# Patient Record
Sex: Female | Born: 1990 | Race: Black or African American | Hispanic: No | Marital: Single | Smoking: Never smoker
Health system: Southern US, Community
[De-identification: ages and names within clinical notes are randomized; demographics above are authoritative.]

## PROBLEM LIST (undated history)

## (undated) ENCOUNTER — Emergency Department: Admission: EM | Payer: Commercial Managed Care - PPO | Source: Home / Self Care

## (undated) DIAGNOSIS — D680 Von Willebrand's disease: Secondary | ICD-10-CM

## (undated) DIAGNOSIS — F419 Anxiety disorder, unspecified: Secondary | ICD-10-CM

## (undated) DIAGNOSIS — D691 Qualitative platelet defects: Secondary | ICD-10-CM

## (undated) HISTORY — DX: Von Willebrand's disease: D68.0

## (undated) HISTORY — DX: Anxiety disorder, unspecified: F41.9

---
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
## 2013-01-29 DIAGNOSIS — D691 Qualitative platelet defects: Secondary | ICD-10-CM

## 2013-01-29 HISTORY — DX: Qualitative platelet defects: D69.1

## 2013-08-27 ENCOUNTER — Encounter: Payer: Self-pay | Admitting: Obstetrics & Gynecology

## 2013-08-27 LAB — CBC WITH DIFFERENTIAL/PLATELET
BASOS ABS: 0.1 10*3/uL (ref 0.0–0.1)
Basophil %: 0.5 %
EOS PCT: 0.9 %
Eosinophil #: 0.1 10*3/uL (ref 0.0–0.7)
HCT: 37.6 % (ref 35.0–47.0)
HGB: 11.8 g/dL — ABNORMAL LOW (ref 12.0–16.0)
Lymphocyte #: 2 10*3/uL (ref 1.0–3.6)
Lymphocyte %: 17.6 %
MCH: 27.2 pg (ref 26.0–34.0)
MCHC: 31.4 g/dL — ABNORMAL LOW (ref 32.0–36.0)
MCV: 87 fL (ref 80–100)
Monocyte #: 0.6 x10 3/mm (ref 0.2–0.9)
Monocyte %: 5.6 %
NEUTROS ABS: 8.5 10*3/uL — AB (ref 1.4–6.5)
Neutrophil %: 75.4 %
Platelet: 175 10*3/uL (ref 150–440)
RBC: 4.33 10*6/uL (ref 3.80–5.20)
RDW: 13.5 % (ref 11.5–14.5)
WBC: 11.2 10*3/uL — ABNORMAL HIGH (ref 3.6–11.0)

## 2013-08-27 LAB — PROTIME-INR
INR: 1
PROTHROMBIN TIME: 13.2 s (ref 11.5–14.7)

## 2013-08-27 LAB — PLATELET FUNCTION ASSAY
COL/ADP PLT FXN SCRN: 71 Seconds (ref 0–100)
COL/EPI PLT FXN SCRN: 109 Seconds

## 2013-09-07 ENCOUNTER — Encounter: Payer: Self-pay | Admitting: Maternal & Fetal Medicine

## 2014-01-05 ENCOUNTER — Inpatient Hospital Stay: Payer: Self-pay

## 2014-01-05 LAB — CBC WITH DIFFERENTIAL/PLATELET
Basophil #: 0 10*3/uL (ref 0.0–0.1)
Basophil %: 0.3 %
EOS ABS: 0 10*3/uL (ref 0.0–0.7)
Eosinophil %: 0.1 %
HCT: 38.2 % (ref 35.0–47.0)
HGB: 12.2 g/dL (ref 12.0–16.0)
LYMPHS ABS: 1.9 10*3/uL (ref 1.0–3.6)
LYMPHS PCT: 13.7 %
MCH: 27.4 pg (ref 26.0–34.0)
MCHC: 31.9 g/dL — ABNORMAL LOW (ref 32.0–36.0)
MCV: 86 fL (ref 80–100)
Monocyte #: 0.5 x10 3/mm (ref 0.2–0.9)
Monocyte %: 3.4 %
NEUTROS ABS: 11.7 10*3/uL — AB (ref 1.4–6.5)
Neutrophil %: 82.5 %
Platelet: 148 10*3/uL — ABNORMAL LOW (ref 150–440)
RBC: 4.45 10*6/uL (ref 3.80–5.20)
RDW: 14.2 % (ref 11.5–14.5)
WBC: 14.1 10*3/uL — AB (ref 3.6–11.0)

## 2014-01-06 LAB — HEMATOCRIT: HCT: 32 % — ABNORMAL LOW (ref 35.0–47.0)

## 2014-01-06 LAB — GC/CHLAMYDIA PROBE AMP

## 2014-05-22 NOTE — Consult Note (Signed)
Referral Information:  Reason for Referral The patient is a 24 yo gravida 1 at 11085w1d gestation who was orginially referred to [facility] on 08/27/2013 for recommendations regarding her history of a mild bleeding disorder which was thought to be von Willebrand disease.  She saw my partner Dr. Leone BrandGrotegut who ordered labwork.  The patient is back today for me to review those results.   Referring Physician [facility]   Prenatal Hx In 2010, the patient presented to Dr. Artis DelayKnowles-Jones for an evaluation of heavy menstrual cycles resulting in anemia.  Dr. Murlean IbaKnowles-Jones's workup was consistent with iron deficiency anemia.  She also sent a partial thrombophila panel and factor VIII and VWF and VW activity levels.  Both her Factor VIII levels and VWF levels were elevated.  Ms. Barrientes's misunderstanding was that this demonstrated VW disease.  The patient was subsequently seen by Dr. Nils Pylehornberg ([facility]) in November of 2010.  Dr. Nils Pylehornberg explained to the patient that she did NOT have VW disease, but among her platelet aggregation studies, the aggregation response to ADP was 55% with a lower limit of normal of 60%.  Dr. Nils Pylehornberg recommended that she return for followup visit and additional testing but Denny Peonrin did not return.  The patient's perception was that she was never contacted with the results.  In 2010, the patient was placed on continous OCP's which controlled her heavy periods. She was essentially amenorrhic on continuous OCPs from 2010 until late last year. Late last year, she had some breakthrough bleeding on the continuous OCPs and presented to [facility].  They took her off her OCPs.  Denny Peonrin states she had normal, light menstrual cycles for a few months while off the OCPs until she got pregnant.  She had her wisdom teeth removed and had no bleeding complications. She does not report mucosal bleeding or easy bruising.  When she cuts her legs with her razor while shaving she needs to  apply direct pressure for approximately 5 minutes and then the bleeding is controlled.   She has had no complications this pregnancy. She had an [facility] 7/29//[facility] and was told she is having a girl and that the anatomy appeared normal.   Past Obstetrical Hx G1 P0   Home Medications: Medication Instructions Status  PreNata Prenatal Multivitamins oral tablet 1 tab(s) orally once a day Active   Allergies:   No Known Allergies:   Vital Signs/Notes:  Nursing Vital Signs: **Vital Signs.:   10-Aug-15 11:31  Vital Signs Type Routine  Temperature Temperature (F) 97.1  Celsius 36.1  Temperature Source oral  Pulse Pulse 75  Respirations Respirations 18  Systolic BP Systolic BP 98  Diastolic BP (mmHg) Diastolic BP (mmHg) 64  Pulse Ox % Pulse Ox % 100   Perinatal Consult:  PGyn Hx Menstrual history as in HPI.  Denies history of abnormal paps. Remote history of Chl which was treated   Past Medical History cont'd See above   PSurg Hx Wisdom teeth extraction, no bleeding complication   FHx No FH of bleeding or clotting abnormalities. 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 BMI=21     Routine Coag:  30-Jul-15 11:07   Prothrombin 13.2  INR 1.0 (INR reference interval applies to patients on anticoagulant therapy. A  single INR therapeutic range for coumarins is not optimal for all indications; however, the suggested range for most indications is 2.0 - 3.0. Exceptions to the INR Reference Range may include: Prosthetic heart valves, acute myocardial infarction, prevention of myocardial infarction, and combinations of aspirin and anticoagulant. The need for a higher or lower target INR must be assessed individually. Reference: The  Pharmacology and Management of the Vitamin K  antagonists: the seventh ACCP Conference on Antithrombotic and Thrombolytic Therapy. Chest.2004 Sept:126 (3suppl): [id number]. A HCT value >55% may artifactually increase the PT.  In one study,  the increase was an average of 25%. Reference:  "Effect on Routine and Special Coagulation Testing Values of Citrate Anticoagulant Adjustment in Patients with High HCT Values." American Journal of Clinical Pathology 2006;126:400-405.)  Col/EPI 109 (0-150 Prolonged closure time for  the col/EPI screen test may  be seen in the following: Platelet dysfunction that is acquired, inherited, or  induced by platelet inhibiting agents, such as aspirin; HCT <35%, and  Platelet count <150,000. Suggest clinical correlation.)  Col/ADP 71 (Result(s) reported on 27 Aug 2013 at 11:29AM.)  Routine Hem:  30-Jul-15 11:07   WBC (CBC)  11.2  RBC (CBC) 4.33  Hemoglobin (CBC)  11.8  Hematocrit (CBC) 37.6  Platelet Count (CBC) 175  MCV 87  MCH 27.2  MCHC  31.4  RDW 13.5  Neutrophil % 75.4  Lymphocyte % 17.6  Monocyte % 5.6  Eosinophil % 0.9  Basophil % 0.5  Neutrophil #  8.5  Lymphocyte # 2.0  Monocyte # 0.6  Eosinophil # 0.1  Basophil # 0.1 (Result(s) reported on 27 Aug 2013 at 11:34AM.)    Additional Lab/Radiology Notes VW Panel:  VWF:RCo 168%, VWF:Ag 254%, FVIII 326%   Impression/Recommendations:  Impression 24 year-old G1 P0 at 44 1/[redacted] weeks gestation with history of heavy menstrual bleeding and MINIMALLY decreased platelet aggregation to the agonist ADP 5 years ago.  NORMAL platelet function screen on 08/27/2013.  No thrombocytopenia.  NO VON WILLEBRAND DISEASE based on testing 2 times in 2010 and once now.   Recommendations This patient was counseled that she has no more risk of bleeding at the time of delivery or with regional anesthesia than any other patient.  She was counseled that the background risk for PPH is 4%.    My only recommendation is  that she avoid ASA and NSAIDs during the postpartum period.  This is a soft recommendation.  She could by prescribed acetaminophen and opioids instead.  Although we will be happy to see her again if necessary, at the present time, I anticipate no special fetal surveillance, neonatal evaluation or follow-up.    Comments Thank you for allowing [place] to participate in her care.    Total Time Spent with Patient 30 minutes   >50% of visit spent in couseling/coordination of care yes   Office Use Only 99244  Level 4 ( ) NEW office consult low complexity, 99214  Office Visit Level 4 ( ) EST detailed office/outpt   Coding Description: MATERNAL CONDITIONS/HISTORY INDICATION(S).   Bleeding Disorder and/or Pt on heparin/coumadin/lovenox.   OTHER: Abnormalities in platelet aggregation studies. Pregnancy.  Electronic Signatures: Lady Deutscher (MD)  (Signed 10-Aug-15 13:16)  Authored: Referral, Home Medications, Allergies, Vital Signs/Notes, Consult, Exam, Lab, Lab/Radiology Notes, Impression, Other Comments, Billing, Coding Description   Last Updated: 10-Aug-15 13:16 by Lady Deutscher (MD)

## 2014-05-22 NOTE — Consult Note (Signed)
Referral Information:  Reason for Referral von Willebrand's disease in pregnancy   Referring Physician [facility]   Prenatal Hx Autumn Mullen is a 24 year-old G1 P0 at 1 3/7 weeks (EDC 12/28/13) who presents for recommendations concerning the management of von Wilebrand's disease in pregnancy.  In 2010, Autumn Mullen presented to Dr. Toya Smothers for an evaluation of heavy menstrual cycles.  Her menses are started just prior and were heavy and Autumn Mullen was noted to be anemia. Dr. Toya Smothers' workup was consistent with iron deficiency anemia.  She also sent a partial thrombophila panel and factor VIII and VWF and VW activity levels.  Both her Factor VIII levels and VWF levels were elevated.  Autumn. Belote's understanding was this demonstrated VW disease.  Autumn Mullen was seen by Dr. Nils Pyle ([facility]) in November of 2010.  Dr. Nils Pyle explained to Autumn Mullen that she did not have VW disease but she did a platelet aggregation study which was slightly abnormal.  Dr. Nils Pyle recommended that she return for followup visit and additioanl testing but Autumn Mullen did not return.  In 2010, she was placed on continous OCP's which controlled her heavy periods. She was essentially amenorrhic on continuous OCPs from 2010 until late last year. Late last year, she had some breakthrough bleeding on the continuous OCPs and presented to [facility].  They took her off her OCPs.  Autumn Mullen states she had normal, light menstrual cycles for a few months while off the OCPs until she got pregnant.  She had her wisdom teeth removed and had no bleeding complications. She does not report mucosal bleeding or easy bruising.  When she cuts her legs with her razor while shaving she needs to apply direct pressure for approximately 5 minutes and then the bleeding is controlled.   She has had no complications this pregnancy. She had an ultrasound yesterday at [facility] and was told she is having a girl and that the anatomy appeared  normal.   Past Obstetrical Hx G1 P0   Home Medications: Medication Instructions Status  PreNata Prenatal Multivitamins oral tablet 1 tab(s) orally once a day Active   Allergies:   No Known Allergies:   Vital Signs/Notes:  Nursing Vital Signs: **Vital Signs.:   30-Jul-15 10:21  Pulse Pulse 85  Systolic BP Systolic BP 101  Diastolic BP (mmHg) Diastolic BP (mmHg) 60   Perinatal Consult:  PGyn Hx Menstrual history as in HPI.  Denies history of abnormal paps. 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  Normal coagulation papel   PSurg Hx Wisdom teeth extraction, no bleeding complication   FHx No FH of bleeding or clotting abnormalities. 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 See HPI.   Fever/Chills No   Abdominal Pain No   Nausea/Vomiting No   SOB/DOE No   Tolerating Diet Yes   Medications/Allergies Reviewed Medications/Allergies reviewed    Additional Lab/Radiology Notes Autumn Mullen 2010: Prothrombin gene mutation neg, Factor V Leiden neg, AT III 106%, Protein C functional 97%, Protein S functions 48%, Factor VIII activity 433% (elevated), VW factor 234% (elevated), VW activity 100%, Normal hemoglobin electrophoresis (hgb A 98.8%, A2 1.2%, Iron sat 3% (low), Serum iron 13 (low), LDH  187, Pt 10.4, INR 1.0, PTT 27, B12 989 (high), folate 15.2 (normal)  Autumn Mullen 2010:  AT III 128%, Protein C functional 102%, Protein S functional 68%, Fibrinogen 346, Plt aggregation: Normal to collagen, 55 (low) to ADP, Normal to Ristocetin, arachidonic acid and Tris Saline, Hct 30, MCV 63, Platelet aggregation to Epi normal, Retic count 52 with elevated immature retic  fraction (19/1)  Prenatal labs: Pap (08/17/13): Normal GC/Chl/Trich: All neg Other prenatal labs not provided   Impression/Recommendations:  Impression Autumn Mullen is a 24 year-old G1 P0 at 6922 3/7 weeks who was worked up for possible VWD in 2010 due to a heavy menstrual cycles at age 24.  She was under the impression that she had VWD. Her workup demonstrated elevated Factor VIII and VWF levels, which is not consistent with VWD.  She was then seen by [facility] and found to have a mildly abnormal platelet aggregation study to ADP, which may be consistent with a platelet aggregation abnormality.  Her fibriongen and coagulation testing have been normal, therefore has no evidence of clotting factor deficiency or hypofibrinogenemia. She does not report a history of surgical bleeding but requires direct pressure for approximately five minutes when cuts herself with a razor.   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  Autumn Mullen also has a history of iron-deficiency anemia and her anemia workup in the past has been suggestive of iron-deficiency anemia though she never has had testing for alpha thalessemia (has a normal hemoglobin electrophoresis, therefore doubt beta-thal).  It is reassuring that she has not had significant surgical bleeding with wisdom teeth extraction and recent menses in the last year off of OCPs have not been heavy.  I have sent the following studies: -CBC -PT/PTT -VW panel including Factor VIII level -Platelet aggregation studies  I have asked her to return in two weeks when Dr. Fayrene FearingJames is here to review these results and help make any additional recommendations.    Total Time Spent with Patient 60 minutes   >50% of visit spent in couseling/coordination of care yes   Office Use Only 99244  Level 4 (60min) NEW office consult low complexity   Coding Description: MATERNAL CONDITIONS/HISTORY INDICATION(S).   OTHER:  Abnormalities in platelet aggregation studies. Pregnancy.  Electronic Signatures: Maxie Slovacek, [place] (MD)  (Signed 30-Jul-15 12:37)  Authored: Referral, Home Medications, Allergies, Vital Signs/Notes, Consult, Exam, Lab/Radiology Notes, Impression, Billing, Coding Description   Last Updated: 30-Jul-15 12:37 by Alistar Mcenery, [place] (MD)

## 2014-06-08 NOTE — H&P (Signed)
L&D Evaluation:  History:  HPI PT is a 24 yo G2P0010 at [redacted] weeks GA by a 21 week u/s presents with contractions that have gotten stronger since yesterday. She reports +FM, denies vb, or lof. Her prenatal course is significant for a possible bleeding disorder which was ruled out by [facility], late entry to care, and anemia. She is B+, RI, VI, GBS positive.   Presents with contractions   Patient's Medical History abnormal pap, anemia   Patient's Surgical History colpo, wisdom teeth   Medications Pre Natal Vitamins  Iron   Allergies NKDA   Social History none   Family History Non-Contributory   ROS:  ROS All systems were reviewed.  HEENT, CNS, GI, GU, Respiratory, CV, Renal and Musculoskeletal systems were found to be normal.   Exam:  Vital Signs stable   General no apparent distress   Mental Status clear   Chest clear   Heart normal sinus rhythm   Abdomen gravid, tender with contractions   Pelvic 3/75/-1 upon arrival now 3/90/-1   Mebranes Intact   FHT normal rate with no decels, 130's, moderate variability, +accels, no decels   Ucx regular, every 2-5   Skin dry, no lesions, no rashes   Lymph no lymphadenopathy   Impression:  Impression early labor, reactive NST, IUP at 41 weeks, r/o labor   Plan:  Plan EFM/NST, monitor contractions and for cervical change, continue to monitor for cervical change, ambulate.   Comments dispo pending cervical check- pt has continued to efface her cervix since she has been under observation.   If admitted- per [facility] try to avoid NSAID's in the postpartum period due to increased bleeding.   Follow Up Appointment already scheduled   Electronic Signatures: Jannet MantisSubudhi, Coleby Yett (CNM)  (Signed 08-Dec-15 06:51)  Authored: L&D Evaluation   Last Updated: 08-Dec-15 06:51 by Jannet MantisSubudhi, Breklyn Fabrizio (CNM)

## 2015-12-09 LAB — HM PAP SMEAR: HM Pap smear: NEGATIVE

## 2017-01-11 ENCOUNTER — Ambulatory Visit: Payer: Commercial Managed Care - PPO | Admitting: Advanced Practice Midwife

## 2017-01-11 ENCOUNTER — Encounter: Payer: Self-pay | Admitting: Advanced Practice Midwife

## 2017-01-11 VITALS — BP 110/66 | HR 59 | Ht 62.0 in | Wt 107.0 lb

## 2017-01-11 DIAGNOSIS — Z113 Encounter for screening for infections with a predominantly sexual mode of transmission: Secondary | ICD-10-CM

## 2017-01-11 NOTE — Progress Notes (Signed)
S: The patient is here today with concerns for possible exposure to STDs. Her boyfriend told her today that his former girlfriend had positive trichomonas infection and that he may have been infected. The patient most recently had unprotected intercourse 2 weeks ago. She is due to come in for her annual exam in about 2 weeks. She denies symptoms such as itching, irritation, burning, discharge, odor. She requests testing for STDs. She has no other concerns today.  O: Vital Signs: BP 110/66 (BP Location: Left Arm, Patient Position: Sitting, Cuff Size: Normal)   Pulse (!) 59   Ht 5\' 2"  (1.575 m)   Wt 107 lb (48.5 kg)   LMP 01/02/2017   BMI 19.57 kg/m  Constitutional: Well nourished, well developed female in no acute distress.  HEENT: normal Skin: Warm and dry.  Cardiovascular: Regular rate and rhythm.    Respiratory: Clear to auscultation bilateral. Normal respiratory effort Psych: Alert and Oriented x3. No memory deficits. Normal mood and affect.  MS: normal gait, normal bilateral lower extremity ROM/strength/stability.  Pelvic exam:  is not limited by body habitus EGBUS: within normal limits Vagina: within normal limits and with normal mucosa  Cervix: normal appearance  A: 26 yo female with possible exposure to STDs  P: NuSwab for STDs Return to office for annual exam and PRN  Tresea MallJane Brealyn Baril, CNM

## 2017-01-16 ENCOUNTER — Other Ambulatory Visit: Payer: Self-pay | Admitting: Advanced Practice Midwife

## 2017-01-16 DIAGNOSIS — A5403 Gonococcal cervicitis, unspecified: Secondary | ICD-10-CM

## 2017-01-16 LAB — CHLAMYDIA/GONOCOCCUS/TRICHOMONAS, NAA
CHLAMYDIA BY NAA: NEGATIVE
Gonococcus by NAA: POSITIVE — AB
Trich vag by NAA: NEGATIVE

## 2017-01-16 MED ORDER — CEFTRIAXONE SODIUM 250 MG IJ SOLR
250.0000 mg | Freq: Once | INTRAMUSCULAR | Status: AC
  Administered 2017-01-17: 250 mg via INTRAMUSCULAR
  Filled 2017-01-16: qty 250

## 2017-01-16 MED ORDER — AZITHROMYCIN 500 MG PO TABS: 1000.0000 mg | ORAL_TABLET | Freq: Once | ORAL | 0 refills | Status: AC

## 2017-01-16 NOTE — Progress Notes (Signed)
The patient was seen on 01/11/2017 for STD testing. The result on 01/16/2017 is gonorrhea positive. Rx sent to pharmacy for Azithromycin and to clinic for Ceftriaxone on 01/16/2017. Patient notified on 01/16/2017. Communicable disease reporting to health department on 01/16/2017.  Tresea MallJane Aleira Deiter, CNM

## 2017-01-17 ENCOUNTER — Ambulatory Visit (INDEPENDENT_AMBULATORY_CARE_PROVIDER_SITE_OTHER): Payer: Commercial Managed Care - PPO

## 2017-01-17 DIAGNOSIS — A5403 Gonococcal cervicitis, unspecified: Secondary | ICD-10-CM

## 2017-01-17 DIAGNOSIS — A549 Gonococcal infection, unspecified: Secondary | ICD-10-CM

## 2017-01-21 ENCOUNTER — Encounter: Payer: Self-pay | Admitting: Obstetrics and Gynecology

## 2017-01-21 ENCOUNTER — Ambulatory Visit (INDEPENDENT_AMBULATORY_CARE_PROVIDER_SITE_OTHER): Payer: Commercial Managed Care - PPO | Admitting: Obstetrics and Gynecology

## 2017-01-21 VITALS — BP 90/60 | HR 82 | Ht 62.0 in | Wt 108.0 lb

## 2017-01-21 DIAGNOSIS — Z1331 Encounter for screening for depression: Secondary | ICD-10-CM

## 2017-01-21 DIAGNOSIS — Z1339 Encounter for screening examination for other mental health and behavioral disorders: Secondary | ICD-10-CM

## 2017-01-21 DIAGNOSIS — Z30019 Encounter for initial prescription of contraceptives, unspecified: Secondary | ICD-10-CM

## 2017-01-21 DIAGNOSIS — Z01419 Encounter for gynecological examination (general) (routine) without abnormal findings: Secondary | ICD-10-CM

## 2017-01-21 LAB — POCT WET PREP WITH KOH
Clue Cells Wet Prep HPF POC: NEGATIVE
KOH Prep POC: NEGATIVE
PH, VAGINAL: 4.5
Trichomonas, UA: NEGATIVE

## 2017-01-21 NOTE — Progress Notes (Signed)
Gynecology Annual Exam  PCP: Patient, No Pcp Per  Chief Complaint  Patient presents with  . Gynecologic Exam    History of Present Illness:  [name] is a 26 y.o. G1P1001 who LMP was Patient's last menstrual period was 01/02/2017., presents today for her annual examination.  Her menses are regular every 28-30 days, lasting 5 day(s).  Dysmenorrhea mild, occurring first 1-2 days of flow. She does not have intermenstrual bleeding.  She is single partner, contraception - condoms most of the time.  Last Pap: 1 year  Results were: no abnormalities /neg HPV DNA not done Hx of STDs: gonorrhea diagnosed recently. Treated on 12/15 per CDC guidelines.  History of Chlamydia in 2017.   There is a family history of breast cancer in her paternal great aunt. There is no FH of ovarian cancer. The patient does do self-breast exams.  Tobacco use: The patient denies current or previous tobacco use. Alcohol use: social drinker Exercise: moderately active  Wants to restart contraception.   The patient wears seatbelts: yes.   The patient reports that domestic violence in her life is absent.   Past Medical History:  Diagnosis Date  . Von Willebrand disease (HCC)     Past Surgical History:  Procedure Laterality Date  . WISDOM TOOTH EXTRACTION  2011    Prior to Admission medications   Not on File   Allergies: No Known Allergies  Obstetric History: G1P1001  Social History   Socioeconomic History  . Marital status: Single    Spouse name: Not on file  . Number of children: Not on file  . Years of education: Not on file  . Highest education level: Not on file  Social Needs  . Financial resource strain: Not on file  . Food insecurity - worry: Not on file  . Food insecurity - inability: Not on file  . Transportation needs - medical: Not on file  . Transportation needs - non-medical: Not on file  Occupational History  . Not on file  Tobacco Use  . Smoking status: Never Smoker  .  Smokeless tobacco: Never Used  Substance and Sexual Activity  . Alcohol use: No    Frequency: Never  . Drug use: No  . Sexual activity: Yes    Partners: Male    Birth control/protection: None  Other Topics Concern  . Not on file  Social History Narrative  . Not on file    Family History  Problem Relation Age of Onset  . Breast cancer Other   . Brain cancer Other     Review of Systems  Constitutional: Negative.   HENT: Negative.   Eyes: Negative.   Respiratory: Negative.   Cardiovascular: Negative.   Gastrointestinal: Negative.   Genitourinary: Negative.   Musculoskeletal: Negative.   Skin: Negative.   Neurological: Negative.   Psychiatric/Behavioral: Negative.      Physical Exam BP 90/60   Pulse 82   Ht 5\' 2"  (1.575 m)   Wt 108 lb (49 kg)   LMP 01/02/2017   BMI 19.75 kg/m    Physical Exam  Constitutional: She is oriented to person, place, and time. She appears well-developed and well-nourished. No distress.  Genitourinary: Uterus normal. Pelvic exam was performed with patient supine. There is no rash, tenderness, lesion or injury on the right labia. There is no rash, tenderness, lesion or injury on the left labia. No erythema, tenderness or bleeding in the vagina. No signs of injury around the vagina. No vaginal  discharge found. Right adnexum does not display mass, does not display tenderness and does not display fullness. Left adnexum does not display mass, does not display tenderness and does not display fullness. Cervix does not exhibit motion tenderness, lesion, discharge or polyp.   Uterus is mobile and anteverted. Uterus is not enlarged, tender or exhibiting a mass.  HENT:  Head: Normocephalic and atraumatic.  Eyes: EOM are normal. No scleral icterus.  Neck: Normal range of motion. Neck supple. No thyromegaly present.  Cardiovascular: Normal rate and regular rhythm. Exam reveals no gallop and no friction rub.  No murmur heard. Pulmonary/Chest: Effort normal  and breath sounds normal. No respiratory distress. She has no wheezes. She has no rales.  Breast exam declined due to recommendation of every 1-2 year breast exams and no concerns today.  Abdominal: Soft. Bowel sounds are normal. She exhibits no distension and no mass. There is no tenderness. There is no rebound and no guarding.  Musculoskeletal: Normal range of motion. She exhibits no edema or tenderness.  Lymphadenopathy:    She has no cervical adenopathy.       Right: No inguinal adenopathy present.       Left: No inguinal adenopathy present.  Neurological: She is alert and oriented to person, place, and time. No cranial nerve deficit.  Skin: Skin is warm and dry. No rash noted. No erythema.  Psychiatric: She has a normal mood and affect. Her behavior is normal. Judgment normal.   Female chaperone present for pelvic and breast  portions of the physical exam  Wet Prep: PH: 4.5 Clue Cells: Negative Fungal elements: Negative Trichomonas: Negative  Results: AUDIT Questionnaire (screen for alcoholism): 2 PHQ-9: 2   Assessment: 26 y.o. 581P1001 female here for routine annual gynecologic examination  Plan: Problem List Items Addressed This Visit    None    Visit Diagnoses    Women's annual routine gynecological examination    -  Primary   Screening for depression       Screening for alcoholism       Encounter for initial prescription of contraceptives, unspecified contraceptive          Screening: -- Blood pressure screen normal -- Weight screening: normal -- Depression screening negative (PHQ-9) -- Nutrition: normal -- cholesterol screening: not due for screening -- osteoporosis screening: not due -- tobacco screening: not using -- alcohol screening: AUDIT questionnaire indicates low-risk usage. -- family history of breast cancer screening: done. not at high risk. -- no evidence of domestic violence or intimate partner violence. -- STD screening: gonorrhea/chlamydia NAAT  not collected given recent testing -- pap smear not collected per ASCCP guidelines  Reviewed all forms of birth control options available including abstinence; over the counter/barrier methods; hormonal contraceptive medication including pill, patch, ring, injection,contraceptive implant; hormonal and nonhormonal IUDs; permanent sterilization options including vasectomy and the various tubal sterilization modalities. Risks and benefits reviewed.  Questions were answered.  Information was given to patient to review.   Thomasene MohairStephen Robert Sunga, MD 01/21/2017 10:09 AM

## 2017-11-26 ENCOUNTER — Other Ambulatory Visit (HOSPITAL_COMMUNITY)
Admission: RE | Admit: 2017-11-26 | Discharge: 2017-11-26 | Disposition: A | Discharge status: Home / Self Care | Payer: Commercial Managed Care - PPO | Source: Ambulatory Visit | Attending: Advanced Practice Midwife | Admitting: Advanced Practice Midwife

## 2017-11-26 ENCOUNTER — Ambulatory Visit (INDEPENDENT_AMBULATORY_CARE_PROVIDER_SITE_OTHER): Payer: Commercial Managed Care - PPO | Admitting: Advanced Practice Midwife

## 2017-11-26 ENCOUNTER — Encounter: Payer: Self-pay | Admitting: Advanced Practice Midwife

## 2017-11-26 VITALS — BP 110/64 | Wt 112.0 lb

## 2017-11-26 DIAGNOSIS — Z3A08 8 weeks gestation of pregnancy: Secondary | ICD-10-CM

## 2017-11-26 DIAGNOSIS — Z113 Encounter for screening for infections with a predominantly sexual mode of transmission: Secondary | ICD-10-CM

## 2017-11-26 DIAGNOSIS — Z3481 Encounter for supervision of other normal pregnancy, first trimester: Secondary | ICD-10-CM | POA: Diagnosis not present

## 2017-11-26 DIAGNOSIS — Z3480 Encounter for supervision of other normal pregnancy, unspecified trimester: Secondary | ICD-10-CM | POA: Insufficient documentation

## 2017-11-26 NOTE — Progress Notes (Signed)
NOB today.  

## 2017-11-26 NOTE — Progress Notes (Signed)
New Obstetric Patient H&P    Chief Complaint: "Desires prenatal care"   History of Present Illness: Patient is a 27 y.o. G2P1001 Not Hispanic or Latino female, presents with amenorrhea and positive home pregnancy test. Patient's last menstrual period was 10/01/2017 (approximate). and based on her  LMP, her EDD is Estimated Date of Delivery: 07/08/18 and her EGA is [redacted]w[redacted]d. Cycles are 4. days, regular, and occur approximately every : 28 days. Her last pap smear was 2 years ago and was no abnormalities.    She had a urine pregnancy test which was positive 1 week(s)  ago. Her last menstrual period was normal and lasted for  4 day(s). Since her LMP she claims she has experienced breast tenderness and fatigue. She denies vaginal bleeding. Her past medical history is noncontributory. Her prior pregnancies are notable for 2015 FT SVD, anemia  Since her LMP, she admits to the use of tobacco products  no She claims she has gained   no pounds since the start of her pregnancy.  There are cats in the home in the home  no  She admits close contact with children on a regular basis  yes  She has had chicken pox in the past yes She has had Tuberculosis exposures, symptoms, or previously tested positive for TB   no Current or past history of domestic violence. no  Genetic Screening/Teratology Counseling: (Includes patient, baby's father, or anyone in either family with:)   1. Patient's age >/= 54 at [facility]  no 2. Thalassemia ([place], [place], Mediterranean, or Asian background): MCV<80  no 3. Neural tube defect (meningomyelocele, spina bifida, anencephaly)  no 4. Congenital heart defect  no  5. Down syndrome  no 6. Tay-Sachs (Jewish, [place]  no 7. Canavan's Disease  no 8. Sickle cell disease or trait (African)  no  9. Hemophilia or other blood disorders  no  10. Muscular dystrophy  no  11. Cystic fibrosis  no  12. Huntington's Chorea  no  13. Mental retardation/autism  no 14. Other inherited  genetic or chromosomal disorder  no 15. Maternal metabolic disorder (DM, PKU, etc)  no 16. Patient or FOB with a child with a birth defect not listed above no  16a. Patient or FOB with a birth defect themselves no 17. Recurrent pregnancy loss, or stillbirth  no  18. Any medications since LMP other than prenatal vitamins (include vitamins, supplements, OTC meds, drugs, alcohol)  no 19. Any other genetic/environmental exposure to discuss  She works with oil/gas and uses Glass blower/designer.  Infection History:   1. Lives with someone with TB or TB exposed  no  2. Patient or partner has history of genital herpes  no 3. Rash or viral illness since LMP  no 4. History of STI (GC, CT, HPV, syphilis, HIV)  no 5. History of recent travel :  no  Other pertinent information:  no     Review of Systems:10 point review of systems negative unless otherwise noted in HPI  Past Medical History:  Past Medical History:  Diagnosis Date  . Von Willebrand disease (HCC)     Past Surgical History:  Past Surgical History:  Procedure Laterality Date  . WISDOM TOOTH EXTRACTION  2011    Gynecologic History: Patient's last menstrual period was 10/01/2017 (approximate).  Obstetric History: G2P1001  Family History:  Family History  Problem Relation Age of Onset  . Breast cancer Other   . Brain cancer Other     Social History:  Social  History   Socioeconomic History  . Marital status: Single    Spouse name: Not on file  . Number of children: Not on file  . Years of education: Not on file  . Highest education level: Not on file  Occupational History  . Not on file  Social Needs  . Financial resource strain: Not on file  . Food insecurity:    Worry: Not on file    Inability: Not on file  . Transportation needs:    Medical: Not on file    Non-medical: Not on file  Tobacco Use  . Smoking status: Never Smoker  . Smokeless tobacco: Never Used  Substance and Sexual Activity  . Alcohol  use: No    Frequency: Never  . Drug use: No  . Sexual activity: Yes    Partners: Male    Birth control/protection: None  Lifestyle  . Physical activity:    Days per week: 0 days    Minutes per session: 0 min  . Stress: Only a little  Relationships  . Social connections:    Talks on phone: More than three times a week    Gets together: Once a week    Attends religious service: More than 4 times per year    Active member of club or organization: Yes    Attends meetings of clubs or organizations: More than 4 times per year    Relationship status: Never married  . Intimate partner violence:    Fear of current or ex partner: No    Emotionally abused: No    Physically abused: No    Forced sexual activity: No  Other Topics Concern  . Not on file  Social History Narrative  . Not on file    Allergies:  No Known Allergies  Medications: Prior to Admission medications   Not on File    Physical Exam Vitals: Blood pressure 110/64, weight 112 lb (50.8 kg), last menstrual period 10/01/2017.  General: NAD HEENT: normocephalic, anicteric Thyroid: no enlargement, no palpable nodules Pulmonary: No increased work of breathing, CTAB Cardiovascular: RRR, distal pulses 2+ Abdomen: NABS, soft, non-tender, non-distended.  Umbilicus without lesions.  No hepatomegaly, splenomegaly or masses palpable. No evidence of hernia  Genitourinary: deferred for PAP interval/no concerns/shared decision making Extremities: no edema, erythema, or tenderness Neurologic: Grossly intact Psychiatric: mood appropriate, affect full   Assessment: 27 y.o. G2P1001 at [redacted]w[redacted]d by approximate LMP, presenting to initiate prenatal care  Plan: 1) Avoid alcoholic beverages. 2) Patient encouraged not to smoke.  3) Discontinue the use of all non-medicinal drugs and chemicals.  4) Take prenatal vitamins daily.  5) Nutrition, food safety (fish, cheese advisories, and high nitrite foods) and exercise discussed. 6)  Hospital and practice style discussed with cross coverage system.  7) Genetic Screening, such as with 1st Trimester Screening, cell free fetal DNA, AFP testing, and Ultrasound, as well as with amniocentesis and CVS as appropriate, is discussed with patient. At the conclusion of today's visit patient requested genetic testing 8) Patient is asked about travel to areas at risk for the Zika virus, and counseled to avoid travel and exposure to mosquitoes or sexual partners who may have themselves been exposed to the virus. Testing is discussed, and will be ordered as appropriate.  9) Urine lab work today 10) Return in 1 week for dating scan and rob 11) NOB panel and sickle cell screen at genetic screening visit   Tresea Mall, CNM [facility], [facility] 11/26/2017, 10:48 AM

## 2017-11-26 NOTE — Patient Instructions (Signed)
Prenatal Care WHAT IS PRENATAL CARE? Prenatal care is the process of caring for a pregnant woman before she gives birth. Prenatal care makes sure that she and her baby remain as healthy as possible throughout pregnancy. Prenatal care may be provided by a midwife, family practice health care provider, or a childbirth and pregnancy specialist (obstetrician). Prenatal care may include physical examinations, testing, treatments, and education on nutrition, lifestyle, and social support services. WHY IS PRENATAL CARE SO IMPORTANT? Early and consistent prenatal care increases the chance that you and your baby will remain healthy throughout your pregnancy. This type of care also decreases a baby's risk of being born too early (prematurely), or being born smaller than expected (small for gestational age). Any underlying medical conditions you may have that could pose a risk during your pregnancy are discussed during prenatal care visits. You will also be monitored regularly for any new conditions that may arise during your pregnancy so they can be treated quickly and effectively. WHAT HAPPENS DURING PRENATAL CARE VISITS? Prenatal care visits may include the following: Discussion Tell your health care provider about any new signs or symptoms you have experienced since your last visit. These might include:  Nausea or vomiting.  Increased or decreased level of energy.  Difficulty sleeping.  Back or leg pain.  Weight changes.  Frequent urination.  Shortness of breath with physical activity.  Changes in your skin, such as the development of a rash or itchiness.  Vaginal discharge or bleeding.  Feelings of excitement or nervousness.  Changes in your baby's movements.  You may want to write down any questions or topics you want to discuss with your health care provider and bring them with you to your appointment. Examination During your first prenatal care visit, you will likely have a complete  physical exam. Your health care provider will often examine your vagina, cervix, and the position of your uterus, as well as check your heart, lungs, and other body systems. As your pregnancy progresses, your health care provider will measure the size of your uterus and your baby's position inside your uterus. He or she may also examine you for early signs of labor. Your prenatal visits may also include checking your blood pressure and, after about 10-12 weeks of pregnancy, listening to your baby's heartbeat. Testing Regular testing often includes:  Urinalysis. This checks your urine for glucose, protein, or signs of infection.  Blood count. This checks the levels of white and red blood cells in your body.  Tests for sexually transmitted infections (STIs). Testing for STIs at the beginning of pregnancy is routinely done and is required in many states.  Antibody testing. You will be checked to see if you are immune to certain illnesses, such as rubella, that can affect a developing fetus.  Glucose screen. Around 24-28 weeks of pregnancy, your blood glucose level will be checked for signs of gestational diabetes. Follow-up tests may be recommended.  Group B strep. This is a bacteria that is commonly found inside a woman's vagina. This test will inform your health care provider if you need an antibiotic to reduce the amount of this bacteria in your body prior to labor and childbirth.  Ultrasound. Many pregnant women undergo an ultrasound screening around 18-20 weeks of pregnancy to evaluate the health of the fetus and check for any developmental abnormalities.  HIV (human immunodeficiency virus) testing. Early in your pregnancy, you will be screened for HIV. If you are at high risk for HIV, this test may   be repeated during your third trimester of pregnancy.  You may be offered other testing based on your age, personal or family medical history, or other factors. HOW OFTEN SHOULD I PLAN TO SEE MY  HEALTH CARE PROVIDER FOR PRENATAL CARE? Your prenatal care check-up schedule depends on any medical conditions you have before, or develop during, your pregnancy. If you do not have any underlying medical conditions, you will likely be seen for checkups:  Monthly, during the first 6 months of pregnancy.  Twice a month during months 7 and 8 of pregnancy.  Weekly starting in the 9th month of pregnancy and until delivery.  If you develop signs of early labor or other concerning signs or symptoms, you may need to see your health care provider more often. Ask your health care provider what prenatal care schedule is best for you. WHAT CAN I DO TO KEEP MYSELF AND MY BABY AS HEALTHY AS POSSIBLE DURING MY PREGNANCY?  Take a prenatal vitamin containing 400 micrograms (0.4 mg) of folic acid every day. Your health care provider may also ask you to take additional vitamins such as iodine, vitamin D, iron, copper, and zinc.  Take 1500-2000 mg of calcium daily starting at your 20th week of pregnancy until you deliver your baby.  Make sure you are up to date on your vaccinations. Unless directed otherwise by your health care provider: ? You should receive a tetanus, diphtheria, and pertussis (Tdap) vaccination between the 27th and 36th week of your pregnancy, regardless of when your last Tdap immunization occurred. This helps protect your baby from whooping cough (pertussis) after he or she is born. ? You should receive an annual inactivated influenza vaccine (IIV) to help protect you and your baby from influenza. This can be done at any point during your pregnancy.  Eat a well-rounded diet that includes: ? Fresh fruits and vegetables. ? Lean proteins. ? Calcium-rich foods such as milk, yogurt, hard cheeses, and dark, leafy greens. ? Whole grain breads.  Do noteat seafood high in mercury, including: ? Swordfish. ? Tilefish. ? Shark. ? King mackerel. ? More than 6 oz tuna per week.  Do not  eat: ? Raw or undercooked meats or eggs. ? Unpasteurized foods, such as soft cheeses (brie, blue, or feta), juices, and milks. ? Lunch meats. ? Hot dogs that have not been heated until they are steaming.  Drink enough water to keep your urine clear or pale yellow. For many women, this may be 10 or more 8 oz glasses of water each day. Keeping yourself hydrated helps deliver nutrients to your baby and may prevent the start of pre-term uterine contractions.  Do not use any tobacco products including cigarettes, chewing tobacco, or electronic cigarettes. If you need help quitting, ask your health care provider.  Do not drink beverages containing alcohol. No safe level of alcohol consumption during pregnancy has been determined.  Do not use any illegal drugs. These can harm your developing baby or cause a miscarriage.  Ask your health care provider or pharmacist before taking any prescription or over-the-counter medicines, herbs, or supplements.  Limit your caffeine intake to no more than 200 mg per day.  Exercise. Unless told otherwise by your health care provider, try to get 30 minutes of moderate exercise most days of the week. Do not  do high-impact activities, contact sports, or activities with a high risk of falling, such as horseback riding or downhill skiing.  Get plenty of rest.  Avoid anything that raises your   body temperature, such as hot tubs and saunas.  If you own a cat, do not empty its litter box. Bacteria contained in cat feces can cause an infection called toxoplasmosis. This can result in serious harm to the fetus.  Stay away from chemicals such as insecticides, lead, mercury, and cleaning or paint products that contain solvents.  Do not have any X-rays taken unless medically necessary.  Take a childbirth and breastfeeding preparation class. Ask your health care provider if you need a referral or recommendation.  This information is not intended to replace advice given  to you by your health care provider. Make sure you discuss any questions you have with your health care provider. Document Released: 01/18/2003 Document Revised: 06/20/2015 Document Reviewed: 04/01/2013 Elsevier Interactive Patient Education  2017 [employer] Eating Plan for Pregnant Women While you are pregnant, your body will require additional nutrition to help support your growing baby. It is recommended that you consume:  150 additional calories each day during your first trimester.  300 additional calories each day during your second trimester.  300 additional calories each day during your third trimester.  Eating a healthy, well-balanced diet is very important for your health and for your baby's health. You also have a higher need for some vitamins and minerals, such as folic acid, calcium, iron, and vitamin D. What do I need to know about eating during pregnancy?  Do not try to lose weight or go on a diet during pregnancy.  Choose healthy, nutritious foods. Choose  of a sandwich with a glass of milk instead of a candy bar or a high-calorie sugar-sweetened beverage.  Limit your overall intake of foods that have "empty calories." These are foods that have little nutritional value, such as sweets, desserts, candies, sugar-sweetened beverages, and fried foods.  Eat a variety of foods, especially fruits and vegetables.  Take a prenatal vitamin to help meet the additional needs during pregnancy, specifically for folic acid, iron, calcium, and vitamin D.  Remember to stay active. Ask your health care provider for exercise recommendations that are specific to you.  Practice good food safety and cleanliness, such as washing your hands before you eat and after you prepare raw meat. This helps to prevent foodborne illnesses, such as listeriosis, that can be very dangerous for your baby. Ask your health care provider for more information about listeriosis. What does 150 extra calories  look like? Healthy options for an additional 150 calories each day could be any of the following:  Plain low-fat yogurt (6-8 oz) with  cup of berries.  1 apple with 2 teaspoons of peanut butter.  Cut-up vegetables with  cup of hummus.  Low-fat chocolate milk (8 oz or 1 cup).  1 string cheese with 1 medium orange.   of a peanut butter and jelly sandwich on whole-wheat bread (1 tsp of peanut butter).  For 300 calories, you could eat two of those healthy options each day. What is a healthy amount of weight to gain? The recommended amount of weight for you to gain is based on your pre-pregnancy BMI. If your pre-pregnancy BMI was:  Less than 18 (underweight), you should gain 28-40 lb.  18-24.9 (normal), you should gain 25-35 lb.  25-29.9 (overweight), you should gain 15-25 lb.  Greater than 30 (obese), you should gain 11-20 lb.  What if I am having twins or multiples? Generally, pregnant women who will be having twins or multiples may need to increase their daily calories by 300-600 calories each   day. The recommended range for total weight gain is 25-54 lb, depending on your pre-pregnancy BMI. Talk with your health care provider for specific guidance about additional nutritional needs, weight gain, and exercise during your pregnancy. What foods can I eat? Grains Any grains. Try to choose whole grains, such as whole-wheat bread, oatmeal, or brown rice. Vegetables Any vegetables. Try to eat a variety of colors and types of vegetables to get a full range of vitamins and minerals. Remember to wash your vegetables well before eating. Fruits Any fruits. Try to eat a variety of colors and types of fruit to get a full range of vitamins and minerals. Remember to wash your fruits well before eating. Meats and Other Protein Sources Lean meats, including chicken, turkey, fish, and lean cuts of beef, veal, or pork. Make sure that all meats are cooked to "well done." Tofu. Tempeh. Beans. Eggs.  Peanut butter and other nut butters. Seafood, such as shrimp, crab, and lobster. If you choose fish, select types that are higher in omega-3 fatty acids, including salmon, herring, mussels, trout, sardines, and pollock. Make sure that all meats are cooked to food-safe temperatures. Dairy Pasteurized milk and milk alternatives. Pasteurized yogurt and pasteurized cheese. Cottage cheese. Sour cream. Beverages Water. Juices that contain 100% fruit juice or vegetable juice. Caffeine-free teas and decaffeinated coffee. Drinks that contain caffeine are okay to drink, but it is better to avoid caffeine. Keep your total caffeine intake to less than 200 mg each day (12 oz of coffee, tea, or soda) or as directed by your health care provider. Condiments Any pasteurized condiments. Sweets and Desserts Any sweets and desserts. Fats and Oils Any fats and oils. The items listed above may not be a complete list of recommended foods or beverages. Contact your dietitian for more options. What foods are not recommended? Vegetables Unpasteurized (raw) vegetable juices. Fruits Unpasteurized (raw) fruit juices. Meats and Other Protein Sources Cured meats that have nitrates, such as bacon, salami, and hotdogs. Luncheon meats, bologna, or other deli meats (unless they are reheated until they are steaming hot). Refrigerated pate, meat spreads from a meat counter, smoked seafood that is found in the refrigerated section of a store. Raw fish, such as sushi or sashimi. High mercury content fish, such as tilefish, shark, swordfish, and king mackerel. Raw meats, such as tuna or beef tartare. Undercooked meats and poultry. Make sure that all meats are cooked to food-safe temperatures. Dairy Unpasteurized (raw) milk and any foods that have raw milk in them. Soft cheeses, such as feta, queso blanco, queso fresco, Brie, Camembert cheeses, blue-veined cheeses, and Panela cheese (unless it is made with pasteurized milk, which must  be stated on the label). Beverages Alcohol. Sugar-sweetened beverages, such as sodas, teas, or energy drinks. Condiments Homemade fermented foods and drinks, such as pickles, sauerkraut, or kombucha drinks. (Store-bought pasteurized versions of these are okay.) Other Salads that are made in the store, such as ham salad, chicken salad, egg salad, tuna salad, and seafood salad. The items listed above may not be a complete list of foods and beverages to avoid. Contact your dietitian for more information. This information is not intended to replace advice given to you by your health care provider. Make sure you discuss any questions you have with your health care provider. Document Released: 10/30/2013 Document Revised: 06/23/2015 Document Reviewed: 06/30/2013 Elsevier Interactive Patient Education  2018 [employer] Exercise During Pregnancy For people of all ages, exercise is an important part of being healthy. Exercise improves   heart and lung function and helps to maintain strength, flexibility, and a healthy body weight. Exercise also boosts energy levels and elevates mood. For most women, maintaining an exercise routine throughout pregnancy is recommended. It is only on rare occasions and with certain medical conditions or pregnancy complications that women may be asked to limit or avoid exercise during pregnancy. What are some other benefits to exercising during pregnancy? Along with maintaining strength and flexibility, exercising throughout pregnancy can help to:  Keep strength in muscles that are very important during labor and childbirth.  Decrease low back pain during pregnancy.  Decrease the risk of developing gestational diabetes mellitus (GDM).  Improve blood sugar (glucose) control for women who have GDM.  Decrease the risk of developing preeclampsia. This is a serious condition that causes high blood pressure along with other symptoms, such as swelling and  headaches.  Decrease the risk of cesarean delivery.  Speed up the recovery after giving birth.  How often should I exercise? Unless your health care provider gives you different instructions, you should try to exercise on most days or all days of the week. In general, try to exercise with moderate intensity for about 150 minutes per week. This can be spread out across several days, such as exercising for 30 minutes per day on 5 days of each week. You can tell that you are exercising at a moderate intensity if you have a higher heart rate and faster breathing, but you are still able to hold a conversation. What types of moderate-intensity exercise are recommended during pregnancy? There are many types of exercise that are safe for you to do during pregnancy. Unless your health care provider gives you different instructions, do a variety of exercises that safely increase your heart and breathing (cardiopulmonary) rates and help you to build and maintain muscle strength (strength training). You should always be able to talk in full sentences while exercising during pregnancy. Some examples of exercising that is safe to do during pregnancy include:  Brisk walking or hiking.  Swimming.  Water aerobics.  Riding a stationary bike.  Strength training.  Modified yoga or Pilates. Tell your instructor that you are pregnant. Avoid overstretching and avoid lying on your back for long periods of time.  Running or jogging. Only choose this type of exercise if: ? You ran or jogged regularly before your pregnancy. ? You can run or jog and still talk in complete sentences.  What types of exercise should I not do during pregnancy? Depending on your level of fitness and whether you exercised regularly before your pregnancy, you may be advised to limit vigorous-intensity exercise during your pregnancy. You can tell that you are exercising at a vigorous intensity if you are breathing much harder and faster  and cannot hold a conversation while exercising. Some examples of exercising that you should avoid during pregnancy include:  Contact sports.  Activities that place you at risk for falling on or being hit in the belly, such as downhill skiing, water skiing, surfing, rock climbing, cycling, gymnastics, and horseback riding.  Scuba diving.  Sky diving.  Yoga or Pilates in a room that is heated to extreme temperatures ("hot yoga" or "hot Pilates").  Jogging or running, unless you ran or jogged regularly before your pregnancy. While jogging or running, you should always be able to talk in full sentences. Do not run or jog so vigorously that you are unable to have a conversation.  If you are not used to exercising at   elevation (more than 6,000 feet above sea level), do not do so during your pregnancy.  When should I avoid exercising during pregnancy? Certain medical conditions can make it unsafe to exercise during pregnancy, or they may increase your risk of miscarriage or early labor and birth. Some of these conditions include:  Some types of heart disease.  Some types of lung disease.  Placenta previa. This is when the placenta partially or completely covers the opening of the uterus (cervix).  Frequent bleeding from the vagina during your pregnancy.  Incompetent cervix. This is when your cervix does not remain as tightly closed during pregnancy as it should.  Premature labor.  Ruptured membranes. This is when the protective sac (amniotic sac) opens up and amniotic fluid leaks from your vagina.  Severely low blood count (anemia).  Preeclampsia or pregnancy-caused high blood pressure.  Carrying more than one baby (multiple gestation) and having an additional risk of early labor.  Poorly controlled diabetes.  Being severely underweight or severely overweight.  Intrauterine growth restriction. This is when your baby's growth and development during pregnancy are slower than  expected.  Other medical conditions. Ask your health care provider if any apply to you.  What else should I know about exercising during pregnancy? You should take these precautions while exercising during pregnancy:  Avoid overheating. ? Wear loose-fitting, breathable clothes. ? Do not exercise in very high temperatures.  Avoid dehydration. Drink enough water before, during, and after exercise to keep your urine clear or pale yellow.  Avoid overstretching. Because of hormone changes during pregnancy, it is easy to overstretch muscles, tendons, and ligaments during pregnancy.  Start slowly and ask your health care provider to recommend types of exercise that are safe for you, if exercising regularly is new for you.  Pregnancy is not a time for exercising to lose weight. When should I seek medical care? You should stop exercising and call your health care provider if you have any unusual symptoms, such as:  Mild uterine contractions or abdominal cramping.  Dizziness that does not improve with rest.  When should I seek immediate medical care? You should stop exercising and call your local emergency services (911 in the [place] if you have any unusual symptoms, such as:  Sudden, severe pain in your low back or your belly.  Uterine contractions or abdominal cramping that do not improve with rest.  Chest pain.  Bleeding or fluid leaking from your vagina.  Shortness of breath.  This information is not intended to replace advice given to you by your health care provider. Make sure you discuss any questions you have with your health care provider. Document Released: 01/15/2005 Document Revised: 06/15/2015 Document Reviewed: 03/25/2014 Elsevier Interactive Patient Education  2018 [employer] Inc[employer]  

## 2017-11-27 LAB — CERVICOVAGINAL ANCILLARY ONLY
CHLAMYDIA, DNA PROBE: NEGATIVE
Neisseria Gonorrhea: NEGATIVE
Trichomonas: NEGATIVE

## 2017-11-27 LAB — URINE DRUG PANEL 7
Amphetamines, Urine: NEGATIVE ng/mL
BARBITURATE QUANT UR: NEGATIVE ng/mL
Benzodiazepine Quant, Ur: NEGATIVE ng/mL
CANNABINOID QUANT UR: NEGATIVE ng/mL
Cocaine (Metab.): NEGATIVE ng/mL
OPIATE QUANT UR: NEGATIVE ng/mL
PCP Quant, Ur: NEGATIVE ng/mL

## 2017-11-28 LAB — URINE CULTURE

## 2017-11-29 ENCOUNTER — Other Ambulatory Visit: Payer: Self-pay | Admitting: Advanced Practice Midwife

## 2017-11-29 DIAGNOSIS — O2340 Unspecified infection of urinary tract in pregnancy, unspecified trimester: Secondary | ICD-10-CM

## 2017-11-29 MED ORDER — CEPHALEXIN 500 MG PO CAPS: 500.0000 mg | ORAL_CAPSULE | Freq: Two times a day (BID) | ORAL | 0 refills | Status: AC

## 2017-11-29 NOTE — Progress Notes (Signed)
Rx Keflex sent to patient pharmacy. Left message for her regarding prescription.

## 2017-12-04 ENCOUNTER — Other Ambulatory Visit: Payer: Commercial Managed Care - PPO

## 2017-12-04 ENCOUNTER — Encounter: Payer: Commercial Managed Care - PPO | Admitting: Maternal Newborn

## 2017-12-06 ENCOUNTER — Ambulatory Visit (INDEPENDENT_AMBULATORY_CARE_PROVIDER_SITE_OTHER): Payer: Commercial Managed Care - PPO

## 2017-12-06 ENCOUNTER — Ambulatory Visit (INDEPENDENT_AMBULATORY_CARE_PROVIDER_SITE_OTHER): Payer: Commercial Managed Care - PPO | Admitting: Maternal Newborn

## 2017-12-06 ENCOUNTER — Encounter: Payer: Self-pay | Admitting: Maternal Newborn

## 2017-12-06 VITALS — BP 118/80 | Wt 114.0 lb

## 2017-12-06 DIAGNOSIS — Z3481 Encounter for supervision of other normal pregnancy, first trimester: Secondary | ICD-10-CM

## 2017-12-06 DIAGNOSIS — Z3480 Encounter for supervision of other normal pregnancy, unspecified trimester: Secondary | ICD-10-CM

## 2017-12-06 DIAGNOSIS — Z3682 Encounter for antenatal screening for nuchal translucency: Secondary | ICD-10-CM

## 2017-12-06 DIAGNOSIS — Z3A09 9 weeks gestation of pregnancy: Secondary | ICD-10-CM

## 2017-12-06 DIAGNOSIS — O3411 Maternal care for benign tumor of corpus uteri, first trimester: Secondary | ICD-10-CM

## 2017-12-06 DIAGNOSIS — N8311 Corpus luteum cyst of right ovary: Secondary | ICD-10-CM

## 2017-12-06 LAB — POCT URINALYSIS DIPSTICK OB
GLUCOSE, UA: NEGATIVE
POC,PROTEIN,UA: NEGATIVE

## 2017-12-06 NOTE — Progress Notes (Signed)
    Routine Prenatal Care Visit  Subjective  Autumn Mullen is a 27 y.o. G2P1001 at [redacted]w[redacted]d being seen today for ongoing prenatal care.  She is currently monitored for the following issues for this low-risk pregnancy and has Supervision of other normal pregnancy, antepartum on their problem list.  ----------------------------------------------------------------------------------- Patient reports no complaints.    Vag. Bleeding: None.    ----------------------------------------------------------------------------------- The following portions of the patient's history were reviewed and updated as appropriate: allergies, current medications, past family history, past medical history, past social history, past surgical history and problem list. Problem list updated.  Objective  Blood pressure 118/80, weight 114 lb (51.7 kg), last menstrual period 10/01/2017. Pregravid weight 112 lb (50.8 kg) Total Weight Gain 2 lb (0.907 kg) Body mass index is 20.85 kg/m.   Urinalysis: Protein Negative, Glucose Negative  Fetal Status: Fetal Heart Rate (bpm): 178         General:  Alert, oriented and cooperative. Patient is in no acute distress.  Skin: Skin is warm and dry. No rash noted.   Cardiovascular: Normal heart rate noted  Respiratory: Normal respiratory effort, no problems with respiration noted  Abdomen: Soft, gravid, appropriate for gestational age. Pain/Pressure: Absent     Pelvic:  Cervical exam deferred        Extremities: Normal range of motion.     Mental Status: Normal mood and affect. Normal behavior. Normal judgment and thought content.     Assessment   27 y.o. G2P1001 at [redacted]w[redacted]d, EDD 07/08/2018 by Last Menstrual Period presenting for a routine prenatal visit.  Plan   pregnancy Problems (from 11/26/17 to present)    Problem Noted Resolved   Supervision of other normal pregnancy, antepartum 11/26/2017 by Tresea Mall, CNM No   Overview Signed 11/26/2017 10:46 AM by Tresea Mall, CNM   [facility] Prenatal Labs  Dating  Blood type:     Genetic Screen 1 Screen:    AFP:     Quad:     NIPS: Antibody:   Anatomic [place]  Rubella:   Varicella: @VZVIGG @  GTT Early:               Third trimester:  RPR:     Rhogam  HBsAg:     TDaP vaccine   Flu Shot: HIV:     Baby Food                                GBS:   Contraception  Pap:  CBB     CS/VBAC NA   Support Person Autumn Mullen           Dating scan today, singleton IUP with size=dates.  Discussed genetic screening, she may opt for MaterniTi 21 if her insurance will cover it. Otherwise, will do first trimester screen.  Return in about 3 weeks (around 12/27/2017) for ROB with First trimester screen/NT scan.  Marcelyn Bruins, CNM 12/06/2017  2:48 PM

## 2017-12-06 NOTE — Patient Instructions (Signed)
First Trimester of Pregnancy The first trimester of pregnancy is from week 1 until the end of week 13 (months 1 through 3). A week after a sperm fertilizes an egg, the egg will implant on the wall of the uterus. This embryo will begin to develop into a baby. Genes from you and your partner will form the baby. The female genes will determine whether the baby will be a boy or a girl. At 6-8 weeks, the eyes and face will be formed, and the heartbeat can be seen on ultrasound. At the end of 12 weeks, all the baby's organs will be formed. Now that you are pregnant, you will want to do everything you can to have a healthy baby. Two of the most important things are to get good prenatal care and to follow your health care provider's instructions. Prenatal care is all the medical care you receive before the baby's birth. This care will help prevent, find, and treat any problems during the pregnancy and childbirth. Body changes during your first trimester Your body goes through many changes during pregnancy. The changes vary from woman to woman.  You may gain or lose a couple of pounds at first.  You may feel sick to your stomach (nauseous) and you may throw up (vomit). If the vomiting is uncontrollable, call your health care provider.  You may tire easily.  You may develop headaches that can be relieved by medicines. All medicines should be approved by your health care provider.  You may urinate more often. Painful urination may mean you have a bladder infection.  You may develop heartburn as a result of your pregnancy.  You may develop constipation because certain hormones are causing the muscles that push stool through your intestines to slow down.  You may develop hemorrhoids or swollen veins (varicose veins).  Your breasts may begin to grow larger and become tender. Your nipples may stick out more, and the tissue that surrounds them (areola) may become darker.  Your gums may bleed and may be  sensitive to brushing and flossing.  Dark spots or blotches (chloasma, mask of pregnancy) may develop on your face. This will likely fade after the baby is born.  Your menstrual periods will stop.  You may have a loss of appetite.  You may develop cravings for certain kinds of food.  You may have changes in your emotions from day to day, such as being excited to be pregnant or being concerned that something may go wrong with the pregnancy and baby.  You may have more vivid and strange dreams.  You may have changes in your hair. These can include thickening of your hair, rapid growth, and changes in texture. Some women also have hair loss during or after pregnancy, or hair that feels dry or thin. Your hair will most likely return to normal after your baby is born.  What to expect at prenatal visits During a routine prenatal visit:  You will be weighed to make sure you and the baby are growing normally.  Your blood pressure will be taken.  Your abdomen will be measured to track your baby's growth.  The fetal heartbeat will be listened to between weeks 10 and 14 of your pregnancy.  Test results from any previous visits will be discussed.  Your health care provider may ask you:  How you are feeling.  If you are feeling the baby move.  If you have had any abnormal symptoms, such as leaking fluid, bleeding, severe headaches,   or abdominal cramping.  If you are using any tobacco products, including cigarettes, chewing tobacco, and electronic cigarettes.  If you have any questions.  Other tests that may be performed during your first trimester include:  Blood tests to find your blood type and to check for the presence of any previous infections. The tests will also be used to check for low iron levels (anemia) and protein on red blood cells (Rh antibodies). Depending on your risk factors, or if you previously had diabetes during pregnancy, you may have tests to check for high blood  sugar that affects pregnant women (gestational diabetes).  Urine tests to check for infections, diabetes, or protein in the urine.  An ultrasound to confirm the proper growth and development of the baby.  Fetal screens for spinal cord problems (spina bifida) and Down syndrome.  HIV (human immunodeficiency virus) testing. Routine prenatal testing includes screening for HIV, unless you choose not to have this test.  You may need other tests to make sure you and the baby are doing well.  Follow these instructions at home: Medicines  Follow your health care provider's instructions regarding medicine use. Specific medicines may be either safe or unsafe to take during pregnancy.  Take a prenatal vitamin that contains at least 600 micrograms (mcg) of folic acid.  If you develop constipation, try taking a stool softener if your health care provider approves. Eating and drinking  Eat a balanced diet that includes fresh fruits and vegetables, whole grains, good sources of protein such as meat, eggs, or tofu, and low-fat dairy. Your health care provider will help you determine the amount of weight gain that is right for you.  Avoid raw meat and uncooked cheese. These carry germs that can cause birth defects in the baby.  Eating four or five small meals rather than three large meals a day may help relieve nausea and vomiting. If you start to feel nauseous, eating a few soda crackers can be helpful. Drinking liquids between meals, instead of during meals, also seems to help ease nausea and vomiting.  Limit foods that are high in fat and processed sugars, such as fried and sweet foods.  To prevent constipation: ? Eat foods that are high in fiber, such as fresh fruits and vegetables, whole grains, and beans. ? Drink enough fluid to keep your urine clear or pale yellow. Activity  Exercise only as directed by your health care provider. Most women can continue their usual exercise routine during  pregnancy. Try to exercise for 30 minutes at least 5 days a week. Exercising will help you: ? Control your weight. ? Stay in shape. ? Be prepared for labor and delivery.  Experiencing pain or cramping in the lower abdomen or lower back is a good sign that you should stop exercising. Check with your health care provider before continuing with normal exercises.  Try to avoid standing for long periods of time. Move your legs often if you must stand in one place for a long time.  Avoid heavy lifting.  Wear low-heeled shoes and practice good posture.  You may continue to have sex unless your health care provider tells you not to. Relieving pain and discomfort  Wear a good support bra to relieve breast tenderness.  Take warm sitz baths to soothe any pain or discomfort caused by hemorrhoids. Use hemorrhoid cream if your health care provider approves.  Rest with your legs elevated if you have leg cramps or low back pain.  If you develop   varicose veins in your legs, wear support hose. Elevate your feet for 15 minutes, 3-4 times a day. Limit salt in your diet. Prenatal care  Schedule your prenatal visits by the twelfth week of pregnancy. They are usually scheduled monthly at first, then more often in the last 2 months before delivery.  Write down your questions. Take them to your prenatal visits.  Keep all your prenatal visits as told by your health care provider. This is important. Safety  Wear your seat belt at all times when driving.  Make a list of emergency phone numbers, including numbers for family, friends, the hospital, and police and fire departments. General instructions  Ask your health care provider for a referral to a local prenatal education class. Begin classes no later than the beginning of month 6 of your pregnancy.  Ask for help if you have counseling or nutritional needs during pregnancy. Your health care provider can offer advice or refer you to specialists for help  with various needs.  Do not use hot tubs, steam rooms, or saunas.  Do not douche or use tampons or scented sanitary pads.  Do not cross your legs for long periods of time.  Avoid cat litter boxes and soil used by cats. These carry germs that can cause birth defects in the baby and possibly loss of the fetus by miscarriage or stillbirth.  Avoid all smoking, herbs, alcohol, and medicines not prescribed by your health care provider. Chemicals in these products affect the formation and growth of the baby.  Do not use any products that contain nicotine or tobacco, such as cigarettes and e-cigarettes. If you need help quitting, ask your health care provider. You may receive counseling support and other resources to help you quit.  Schedule a dentist appointment. At home, brush your teeth with a soft toothbrush and be gentle when you floss. Contact a health care provider if:  You have dizziness.  You have mild pelvic cramps, pelvic pressure, or nagging pain in the abdominal area.  You have persistent nausea, vomiting, or diarrhea.  You have a bad smelling vaginal discharge.  You have pain when you urinate.  You notice increased swelling in your face, hands, legs, or ankles.  You are exposed to fifth disease or chickenpox.  You are exposed to German measles (rubella) and have never had it. Get help right away if:  You have a fever.  You are leaking fluid from your vagina.  You have spotting or bleeding from your vagina.  You have severe abdominal cramping or pain.  You have rapid weight gain or loss.  You vomit blood or material that looks like coffee grounds.  You develop a severe headache.  You have shortness of breath.  You have any kind of trauma, such as from a fall or a car accident. Summary  The first trimester of pregnancy is from week 1 until the end of week 13 (months 1 through 3).  Your body goes through many changes during pregnancy. The changes vary from  woman to woman.  You will have routine prenatal visits. During those visits, your health care provider will examine you, discuss any test results you may have, and talk with you about how you are feeling. This information is not intended to replace advice given to you by your health care provider. Make sure you discuss any questions you have with your health care provider. Document Released: 01/09/2001 Document Revised: 12/28/2015 Document Reviewed: 12/28/2015 Elsevier Interactive Patient Education  2018 Elsevier   Inc.  

## 2017-12-06 NOTE — Progress Notes (Signed)
ROB/Dating-wants you to take a look at her skin care for face

## 2017-12-30 ENCOUNTER — Ambulatory Visit (INDEPENDENT_AMBULATORY_CARE_PROVIDER_SITE_OTHER): Payer: Commercial Managed Care - PPO

## 2017-12-30 ENCOUNTER — Ambulatory Visit (INDEPENDENT_AMBULATORY_CARE_PROVIDER_SITE_OTHER): Payer: Commercial Managed Care - PPO | Admitting: Obstetrics and Gynecology

## 2017-12-30 ENCOUNTER — Encounter: Payer: Self-pay | Admitting: Obstetrics and Gynecology

## 2017-12-30 VITALS — BP 110/64 | Wt 115.0 lb

## 2017-12-30 DIAGNOSIS — Z3682 Encounter for antenatal screening for nuchal translucency: Secondary | ICD-10-CM

## 2017-12-30 DIAGNOSIS — Z3480 Encounter for supervision of other normal pregnancy, unspecified trimester: Secondary | ICD-10-CM

## 2017-12-30 DIAGNOSIS — Z3481 Encounter for supervision of other normal pregnancy, first trimester: Secondary | ICD-10-CM

## 2017-12-30 DIAGNOSIS — Z3A12 12 weeks gestation of pregnancy: Secondary | ICD-10-CM

## 2017-12-30 DIAGNOSIS — Z1379 Encounter for other screening for genetic and chromosomal anomalies: Secondary | ICD-10-CM

## 2017-12-30 LAB — POCT URINALYSIS DIPSTICK OB
Glucose, UA: NEGATIVE
POC,PROTEIN,UA: NEGATIVE

## 2017-12-30 NOTE — Addendum Note (Signed)
Addended by: Liliane ShiSHAW, BEVERLY M on: 12/30/2017 11:01 AM   Modules accepted: Orders

## 2017-12-30 NOTE — Progress Notes (Signed)
Routine Prenatal Care Visit  Subjective  Autumn Mullen is a 27 y.o. G2P1001 at 3484w6d being seen today for ongoing prenatal care.  She is currently monitored for the following issues for this low-risk pregnancy and has Supervision of other normal pregnancy, antepartum on their problem list.  ----------------------------------------------------------------------------------- Patient reports no complaints.    . Vag. Bleeding: None.   . Denies leaking of fluid.  NT u/s complete. NT measurement 2.3 mm.  ----------------------------------------------------------------------------------- The following portions of the patient's history were reviewed and updated as appropriate: allergies, current medications, past family history, past medical history, past social history, past surgical history and problem list. Problem list updated.   Objective  Blood pressure 110/64, weight 115 lb (52.2 kg), last menstrual period 10/01/2017. Pregravid weight 112 lb (50.8 kg) Total Weight Gain 3 lb (1.361 kg) Urinalysis: Urine Protein    Urine Glucose    Fetal Status: Fetal Heart Rate (bpm): 155         General:  Alert, oriented and cooperative. Patient is in no acute distress.  Skin: Skin is warm and dry. No rash noted.   Cardiovascular: Normal heart rate noted  Respiratory: Normal respiratory effort, no problems with respiration noted  Abdomen: Soft, gravid, appropriate for gestational age.       Pelvic:  Cervical exam deferred        Extremities: Normal range of motion.     Mental Status: Normal mood and affect. Normal behavior. Normal judgment and thought content.   Imaging Results: [place] Fetal Nuchal Translucency Measurement  Result Date: 12/30/2017 Patient Name: Autumn Mullen DOB: 1990-08-23 MRN: [id number] ULTRASOUND REPORT Location: [facility] Date of Service: 12/30/2017 Indications:First Trimester Screen - NT Findings: Singleton intrauterine pregnancy is visualized with a CRL consistent with 1531w5d   gestation, giving an (U/S) EDD of 07/09/2018. The (U/S) EDD is consistent with the clinically established Estimated Date of Delivery: 07/08/18. FHR: 163 BPM CRL measurement: 63.3 mm NT measurement: 2.3 mm. Yolk sac and and early anatomy is normal. Amnion is visualized. Nasal Bone is Present Right Ovary is normal in appearance. Left Ovary is normal in appearance. Survey of the adnexa demonstrates no adnexal masses. There is no free peritoneal fluid in the cul de sac. Impression: 1. 6331w5d viable Singleton Intrauterine pregnancy by U/S. 2. (U/S) EDD is consistent with Clinically established Estimated Date of Delivery: 07/08/18. 3. NT Screen is successfully completed. Darlina GuysAbby M Clarke, RDMS RVT Mital Patel, RDMS There is a viable  singleton gestation.  The fetal biometry correlates with established dating Detailed evaluation of the fetal anatomy is precluded by early gestational age.The NT measurement is less than 3mm.    It must be noted that a normal ultrasound is unable to definitively rule out fetal aneuploidy.  Thomasene MohairStephen Dontarius Sheley, MD, Merlinda FrederickFACOG [facility], [facility] 12/30/2017, 10:37 AM.     Assessment   27 y.o. G2P1001 at 1484w6d by  07/08/2018, by Last Menstrual Period presenting for routine prenatal visit  Plan   pregnancy Problems (from 11/26/17 to present)    Problem Noted Resolved   Supervision of other normal pregnancy, antepartum 11/26/2017 by Tresea MallGledhill, Jane, CNM No   Overview Signed 11/26/2017 10:46 AM by Tresea MallGledhill, Jane, CNM    Clinic [facility] Prenatal Labs  Dating  Blood type:     Genetic Screen 1 Screen:    AFP:     Quad:     NIPS: Antibody:   Anatomic US  Rubella:   Varicella: @VZVIGG @  GTT Early:  Third trimester:  RPR:     Rhogam  HBsAg:     TDaP vaccine   Flu Shot: HIV:     Baby Food                                GBS:   Contraception  Pap:  CBB     CS/VBAC NA   Support Person Terrius             Preterm labor symptoms and general obstetric precautions  including but not limited to vaginal bleeding, contractions, leaking of fluid and fetal movement were reviewed in detail with the patient. Please refer to After Visit Summary for other counseling recommendations.   Return in about 4 weeks (around 01/27/2018) for Routine Prenatal Appointment.  Thomasene Mohair, MD, [facility], [facility] 12/30/2017 10:55 AM

## 2018-01-01 LAB — RPR+RH+ABO+RUB AB+AB SCR+CB...
Antibody Screen: NEGATIVE
HIV Screen 4th Generation wRfx: NONREACTIVE
Hematocrit: 32.8 % — ABNORMAL LOW (ref 34.0–46.6)
Hemoglobin: 9.8 g/dL — ABNORMAL LOW (ref 11.1–15.9)
Hepatitis B Surface Ag: NEGATIVE
MCH: 22.3 pg — ABNORMAL LOW (ref 26.6–33.0)
MCHC: 29.9 g/dL — ABNORMAL LOW (ref 31.5–35.7)
MCV: 75 fL — ABNORMAL LOW (ref 79–97)
Platelets: 283 10*3/uL (ref 150–450)
RBC: 4.39 x10E6/uL (ref 3.77–5.28)
RDW: 20 % — ABNORMAL HIGH (ref 12.3–15.4)
RPR: NONREACTIVE
Rh Factor: POSITIVE
Rubella Antibodies, IGG: 2.56 index (ref >0.99)
Varicella zoster IgG: 590 index (ref >165)
WBC: 10.1 10*3/uL (ref 3.4–10.8)

## 2018-01-01 LAB — FIRST TRIMESTER SCREEN W/NT
CRL: 63.3 mm
DIA MOM: 1.43
DIA VALUE: 400.9 pg/mL
Gest Age-Collect: 12.6 weeks
HCG MOM: 1.23
Maternal Age At EDD: 27.1 yr
Nuchal Translucency MoM: 1.59
Nuchal Translucency: 2.3 mm
Number of Fetuses: 1
PAPP-A MOM: 0.8
PAPP-A VALUE: 1066 ng/mL
Test Results:: NEGATIVE
Weight: 115 [lb_av]
hCG Value: 133.4 IU/mL

## 2018-01-01 LAB — HEMOGLOBINOPATHY EVALUATION
HGB C: 0 %
HGB S: 0 %
HGB VARIANT: 0 %
Hemoglobin A2 Quantitation: 1.6 % — ABNORMAL LOW (ref 1.8–3.2)
Hemoglobin F Quantitation: 0 % (ref 0.0–2.0)
Hgb A: 98.4 % (ref 96.4–98.8)

## 2018-01-15 ENCOUNTER — Telehealth: Payer: Self-pay

## 2018-01-15 NOTE — Telephone Encounter (Signed)
Pt calling to see what she can take for congestion and sore throat.  [phone]  Left detailed msg plain sudafed, warm salt water gargles, hot beverages/soup, push fluids, rest, e.s. tylenol.

## 2018-01-27 ENCOUNTER — Encounter: Payer: Self-pay | Admitting: Obstetrics and Gynecology

## 2018-01-27 ENCOUNTER — Ambulatory Visit (INDEPENDENT_AMBULATORY_CARE_PROVIDER_SITE_OTHER): Payer: Commercial Managed Care - PPO | Admitting: Obstetrics and Gynecology

## 2018-01-27 VITALS — BP 98/60 | Wt 114.0 lb

## 2018-01-27 DIAGNOSIS — Z3482 Encounter for supervision of other normal pregnancy, second trimester: Secondary | ICD-10-CM

## 2018-01-27 DIAGNOSIS — Z3A16 16 weeks gestation of pregnancy: Secondary | ICD-10-CM

## 2018-01-27 DIAGNOSIS — Z3480 Encounter for supervision of other normal pregnancy, unspecified trimester: Secondary | ICD-10-CM

## 2018-01-27 LAB — POCT URINALYSIS DIPSTICK OB: Glucose, UA: NEGATIVE

## 2018-01-27 NOTE — Progress Notes (Signed)
  Routine Prenatal Care Visit  Subjective  Autumn Mullen is a 27 y.o. G2P1001 at 4746w6d being seen today for ongoing prenatal care.  She is currently monitored for the following issues for this low-risk pregnancy and has Supervision of other normal pregnancy, antepartum on their problem list.  ----------------------------------------------------------------------------------- Patient reports mild nausea that started today. No emesis. Declines medication.    . Vag. Bleeding: None.  Movement: Present. Denies leaking of fluid.  ----------------------------------------------------------------------------------- The following portions of the patient's history were reviewed and updated as appropriate: allergies, current medications, past family history, past medical history, past social history, past surgical history and problem list. Problem list updated.   Objective  Blood pressure 98/60, weight 114 lb (51.7 kg), last menstrual period 10/01/2017. Pregravid weight 112 lb (50.8 kg) Total Weight Gain 2 lb (0.907 kg) Urinalysis: Urine Protein Trace  Urine Glucose Negative  Fetal Status: Fetal Heart Rate (bpm): 140   Movement: Present     General:  Alert, oriented and cooperative. Patient is in no acute distress.  Skin: Skin is warm and dry. No rash noted.   Cardiovascular: Normal heart rate noted  Respiratory: Normal respiratory effort, no problems with respiration noted  Abdomen: Soft, gravid, appropriate for gestational age. Pain/Pressure: Absent     Pelvic:  Cervical exam deferred        Extremities: Normal range of motion.     Mental Status: Normal mood and affect. Normal behavior. Normal judgment and thought content.   Assessment   27 y.o. G2P1001 at 3246w6d by  07/08/2018, by Last Menstrual Period presenting for routine prenatal visit  Plan   pregnancy Problems (from 11/26/17 to present)    Problem Noted Resolved   Supervision of other normal pregnancy, antepartum 11/26/2017 by Tresea MallGledhill,  Jane, CNM No   Overview Signed 11/26/2017 10:46 AM by Tresea MallGledhill, Jane, CNM    Clinic [facility] Prenatal Labs  Dating  Blood type:     Genetic Screen 1 Screen:    AFP:     Quad:     NIPS: Antibody:   Anatomic US  Rubella:   Varicella: @VZVIGG @  GTT Early:               Third trimester:  RPR:     Rhogam  HBsAg:     TDaP vaccine   Flu Shot: HIV:     Baby Food                                GBS:   Contraception  Pap:  CBB     CS/VBAC NA   Support Person Terrius              Preterm labor symptoms and general obstetric precautions including but not limited to vaginal bleeding, contractions, leaking of fluid and fetal movement were reviewed in detail with the patient. Please refer to After Visit Summary for other counseling recommendations.   Return in about 2 weeks (around 02/10/2018) for U/S for anatomy and routine prenatal.  Thomasene MohairStephen Zettie Gootee, MD, Merlinda FrederickFACOG [facility], [facility] 01/27/2018 4:34 PM

## 2018-01-29 DIAGNOSIS — O99345 Other mental disorders complicating the puerperium: Secondary | ICD-10-CM

## 2018-01-29 DIAGNOSIS — F53 Postpartum depression: Secondary | ICD-10-CM

## 2018-01-29 HISTORY — DX: Postpartum depression: F53.0

## 2018-01-29 HISTORY — DX: Other mental disorders complicating the puerperium: O99.345

## 2018-01-29 NOTE — L&D Delivery Note (Signed)
Delivery Note Primary OB: [facility] Provider: Avel Sensor, CNM Gestational Age: Full term Antepartum complications: none Intrapartum complications: Shoulder Dystocia  A viable Female was delivered via vertex presentation, LOA. A loose nuchal cord was present and delivered through. Delivery of the shoulders was delayed, and the patient was placed in McRoberts position, which did not accomplish delivery of the anterior shoulder. Suprapubic pressure was applied, which did not accomplish delivery of the anterior shoulder. As I began to reach for the posterior arm, the anterior shoulder rotated and was delivered, followed by the posterior shoulder and body without difficulty. The infant was placed on the maternal abdomen. Following delivery, she was moving all extremities well. The umbilical cord was doubly clamped and cut following delayed cord clamping. The placenta was delivered spontaneously and was inspected and found to be intact with a three vessel cord. The cervix and vagina were inspected. There were no lacerations.  The fundus was firm. Patient and infant were bonding in stable condition. All counts were correct.  Apgars: 8, 9  Weight:  8 lb 0 oz .    Anesthesia:  none Episiotomy:  none Lacerations:  none Suture Repair: none Quantitative Blood Loss (mL):  70  Mom to postpartum.  Baby to Couplet care / Skin to Skin.  Avel Sensor, CNM [facility], [facility] 07/07/2018  10:08 AM

## 2018-02-10 ENCOUNTER — Encounter: Payer: Self-pay | Admitting: Obstetrics and Gynecology

## 2018-02-10 ENCOUNTER — Ambulatory Visit: Payer: Commercial Managed Care - PPO

## 2018-02-10 ENCOUNTER — Ambulatory Visit (INDEPENDENT_AMBULATORY_CARE_PROVIDER_SITE_OTHER): Payer: Commercial Managed Care - PPO | Admitting: Obstetrics and Gynecology

## 2018-02-10 ENCOUNTER — Ambulatory Visit (INDEPENDENT_AMBULATORY_CARE_PROVIDER_SITE_OTHER): Payer: Commercial Managed Care - PPO

## 2018-02-10 ENCOUNTER — Encounter: Payer: Commercial Managed Care - PPO | Admitting: Obstetrics and Gynecology

## 2018-02-10 VITALS — BP 118/70 | Wt 117.0 lb

## 2018-02-10 DIAGNOSIS — Z363 Encounter for antenatal screening for malformations: Secondary | ICD-10-CM | POA: Diagnosis not present

## 2018-02-10 DIAGNOSIS — O99012 Anemia complicating pregnancy, second trimester: Secondary | ICD-10-CM

## 2018-02-10 DIAGNOSIS — Z3A18 18 weeks gestation of pregnancy: Secondary | ICD-10-CM

## 2018-02-10 DIAGNOSIS — Z3480 Encounter for supervision of other normal pregnancy, unspecified trimester: Secondary | ICD-10-CM

## 2018-02-10 DIAGNOSIS — O99612 Diseases of the digestive system complicating pregnancy, second trimester: Secondary | ICD-10-CM

## 2018-02-10 DIAGNOSIS — K5901 Slow transit constipation: Secondary | ICD-10-CM

## 2018-02-10 MED ORDER — DOCUSATE SODIUM 100 MG PO CAPS: 100.0000 mg | ORAL_CAPSULE | Freq: Two times a day (BID) | ORAL | 2 refills | Status: DC | PRN

## 2018-02-10 NOTE — Patient Instructions (Signed)

## 2018-02-10 NOTE — Progress Notes (Signed)
    Routine Prenatal Care Visit  Subjective  Autumn Mullen is a 28 y.o. G2P1001 at [redacted]w[redacted]d being seen today for ongoing prenatal care.  She is currently monitored for the following issues for this low-risk pregnancy and has Supervision of other normal pregnancy, antepartum on their problem list.  ----------------------------------------------------------------------------------- Patient reports no complaints.   Contractions: Not present. Vag. Bleeding: None.  Movement: Present. Denies leaking of fluid.  ----------------------------------------------------------------------------------- The following portions of the patient's history were reviewed and updated as appropriate: allergies, current medications, past family history, past medical history, past social history, past surgical history and problem list. Problem list updated.   Objective  Blood pressure 118/70, weight 117 lb (53.1 kg), last menstrual period 10/01/2017. Pregravid weight 112 lb (50.8 kg) Total Weight Gain 5 lb (2.268 kg) Urinalysis:      Fetal Status: Fetal Heart Rate (bpm): 150   Movement: Present     General:  Alert, oriented and cooperative. Patient is in no acute distress.  Skin: Skin is warm and dry. No rash noted.   Cardiovascular: Normal heart rate noted  Respiratory: Normal respiratory effort, no problems with respiration noted  Abdomen: Soft, gravid, appropriate for gestational age. Pain/Pressure: Absent     Pelvic:  Cervical exam deferred        Extremities: Normal range of motion.     Mental Status: Normal mood and affect. Normal behavior. Normal judgment and thought content.     Assessment   28 y.o. G2P1001 at [redacted]w[redacted]d by  07/08/2018, by Last Menstrual Period presenting for routine prenatal visit  Plan   pregnancy Problems (from 11/26/17 to present)    Problem Noted Resolved   Supervision of other normal pregnancy, antepartum 11/26/2017 by Tresea Mall, CNM No   Overview Addendum 02/10/2018  9:02 AM by  Natale Milch, MD    Clinic [facility] Prenatal Labs  Dating  Blood type: B/Positive/-- (12/02 1100)   Genetic Screen 1 Screen: negative    Antibody:Negative (12/02 1100)  Anatomic [place] Incomplete- girl Rubella: 2.56 (12/02 1100) Varicella: Immune  GTT  Third trimester:  RPR: Non Reactive (12/02 1100)   Rhogam  not needed HBsAg: Negative (12/02 1100)   TDaP vaccine   Flu Shot:Declined HIV: Non Reactive (12/02 1100)   Baby Food                                GBS:   Contraception  Pap:2017 NIL  CBB     CS/VBAC NA   Support Person Terrius              Gestational age appropriate obstetric precautions including but not limited to vaginal bleeding, contractions, leaking of fluid and fetal movement were reviewed in detail with the patient.    Declines flu shot  She is not taking IRON, advised to start. Offered referral to hematology. She declines because she is afraid of needles Discussed risks of hemorrhage and importance of increasing dietary intake of iron and starting iron supplementation.  Provided with hand out of iron rich foods.  Discussed colace and vitamin c supplementation Given list of prenatal classes  Return in about 2 weeks (around 02/24/2018) for ROB and [place].  Natale Milch MD [facility], [facility] 02/10/2018, 9:04 AM

## 2018-02-10 NOTE — Progress Notes (Signed)
ROB/Anatomy  No concerns  ITS A GIRL!

## 2018-02-11 LAB — IRON AND TIBC
IRON SATURATION: 4 % — AB (ref 15–55)
Iron: 19 ug/dL — ABNORMAL LOW (ref 27–159)
Total Iron Binding Capacity: 513 ug/dL — ABNORMAL HIGH (ref 250–450)
UIBC: 494 ug/dL — ABNORMAL HIGH (ref 131–425)

## 2018-02-11 LAB — VITAMIN B12: Vitamin B-12: 463 pg/mL (ref 232–1245)

## 2018-02-11 LAB — FERRITIN: Ferritin: 11 ng/mL — ABNORMAL LOW (ref 15–150)

## 2018-02-11 LAB — FOLATE: FOLATE: 14.6 ng/mL (ref >3.0)

## 2018-02-11 NOTE — Progress Notes (Signed)
Called and discussed with the patient. Again advised her that she should see hematology for possible IV iron transfusion because these values are so low and because she is not taking oral iron. She will consider.

## 2018-02-24 ENCOUNTER — Ambulatory Visit (INDEPENDENT_AMBULATORY_CARE_PROVIDER_SITE_OTHER): Payer: Commercial Managed Care - PPO

## 2018-02-24 ENCOUNTER — Encounter: Payer: Self-pay | Admitting: Maternal Newborn

## 2018-02-24 ENCOUNTER — Ambulatory Visit (INDEPENDENT_AMBULATORY_CARE_PROVIDER_SITE_OTHER): Payer: Commercial Managed Care - PPO | Admitting: Maternal Newborn

## 2018-02-24 VITALS — BP 100/50 | Wt 117.0 lb

## 2018-02-24 DIAGNOSIS — Z23 Encounter for immunization: Secondary | ICD-10-CM

## 2018-02-24 DIAGNOSIS — O99012 Anemia complicating pregnancy, second trimester: Secondary | ICD-10-CM | POA: Diagnosis not present

## 2018-02-24 DIAGNOSIS — Z362 Encounter for other antenatal screening follow-up: Secondary | ICD-10-CM

## 2018-02-24 DIAGNOSIS — Z3480 Encounter for supervision of other normal pregnancy, unspecified trimester: Secondary | ICD-10-CM

## 2018-02-24 DIAGNOSIS — Z3482 Encounter for supervision of other normal pregnancy, second trimester: Secondary | ICD-10-CM

## 2018-02-24 DIAGNOSIS — Z3A20 20 weeks gestation of pregnancy: Secondary | ICD-10-CM | POA: Diagnosis not present

## 2018-02-24 NOTE — Patient Instructions (Signed)

## 2018-02-24 NOTE — Progress Notes (Signed)
ROB/US Its a GIRL! C/o pelvis soreness, on and off pelvic pressure, pelvic area feels swollen Flu shot today

## 2018-02-24 NOTE — Progress Notes (Signed)
    Routine Prenatal Care Visit  Subjective  Autumn Mullen is a 28 y.o. G2P1001 at [redacted]w[redacted]d being seen today for ongoing prenatal care.  She is currently monitored for the following issues for this low-risk pregnancy and has Supervision of other normal pregnancy, antepartum on their problem list.  ----------------------------------------------------------------------------------- Patient reports some intermittent pelvic floor pain/pressure. Resolves on its own after time. Contractions: Not present. Vag. Bleeding: None.  Movement: Present. No leaking of fluid.  ----------------------------------------------------------------------------------- The following portions of the patient's history were reviewed and updated as appropriate: allergies, current medications, past family history, past medical history, past social history, past surgical history and problem list. Problem list updated.   Objective  Blood pressure (!) 100/50, weight 117 lb (53.1 kg), last menstrual period 10/01/2017. Pregravid weight 112 lb (50.8 kg) Total Weight Gain 5 lb (2.268 kg) Body mass index is 21.4 kg/m.   Fetal Status: Fetal Heart Rate (bpm): 153   Movement: Present  Presentation: Complete Breech  General:  Alert, oriented and cooperative. Patient is in no acute distress.  Skin: Skin is warm and dry. No rash noted.   Cardiovascular: Normal heart rate noted  Respiratory: Normal respiratory effort, no problems with respiration noted  Abdomen: Soft, gravid, appropriate for gestational age. Pain/Pressure: Present     Pelvic:  Cervical exam deferred        Extremities: Normal range of motion.     Mental Status: Normal mood and affect. Normal behavior. Normal judgment and thought content.     Assessment   28 y.o. G2P1001 at [redacted]w[redacted]d, EDD 07/08/2018 by Last Menstrual Period presenting for a routine prenatal visit.  Plan   pregnancy Problems (from 11/26/17 to present)    Problem Noted Resolved   Supervision of other  normal pregnancy, antepartum 11/26/2017 by Tresea Mall, CNM No   Overview Addendum 02/10/2018  9:02 AM by Natale Milch, MD    [facility]inic [facility] Prenatal Labs  Dating  Blood type: B/Positive/-- (12/02 1100)   Genetic Screen 1 Screen: negative    Antibody:Negative (12/02 1100)  Anatomic [place] Incomplete- girl Rubella: 2.56 (12/02 1100) Varicella: Immune  GTT  Third trimester:  RPR: Non Reactive (12/02 1100)   Rhogam  not needed HBsAg: Negative (12/02 1100)   TDaP vaccine   Flu Shot:Declined HIV: Non Reactive (12/02 1100)   Baby Food                                GBS:   Contraception  Pap:2017 NIL  CBB     CS/VBAC NA   Support Person Terrius           Anatomy follow up today complete for 4 chambers of heart/outflow tracts and spine.  Breech presentation. Normal anatomy survey is complete.  Accepted flu vaccine today.  Discussed comfort measures for pelvic floor discomfort: Tylenol, pregnancy support band.   Please refer to After Visit Summary for other counseling recommendations.   Return in about 4 weeks (around 03/24/2018) for ROB.  Marcelyn Bruins, CNM 02/24/2018

## 2018-02-28 ENCOUNTER — Telehealth: Payer: Self-pay

## 2018-02-28 NOTE — Telephone Encounter (Signed)
Pt is 2m; woke up this am and left leg went out on her; can't put a lot of pressure on it.  What to do?  [phone]

## 2018-03-10 ENCOUNTER — Ambulatory Visit (INDEPENDENT_AMBULATORY_CARE_PROVIDER_SITE_OTHER): Payer: Commercial Managed Care - PPO | Admitting: Obstetrics and Gynecology

## 2018-03-10 ENCOUNTER — Encounter: Payer: Self-pay | Admitting: Obstetrics and Gynecology

## 2018-03-10 VITALS — BP 114/68 | Wt 120.0 lb

## 2018-03-10 DIAGNOSIS — B379 Candidiasis, unspecified: Secondary | ICD-10-CM

## 2018-03-10 DIAGNOSIS — N760 Acute vaginitis: Secondary | ICD-10-CM

## 2018-03-10 DIAGNOSIS — B9689 Other specified bacterial agents as the cause of diseases classified elsewhere: Secondary | ICD-10-CM | POA: Diagnosis not present

## 2018-03-10 DIAGNOSIS — Z331 Pregnant state, incidental: Secondary | ICD-10-CM | POA: Diagnosis not present

## 2018-03-10 DIAGNOSIS — Z3A22 22 weeks gestation of pregnancy: Secondary | ICD-10-CM

## 2018-03-10 MED ORDER — FLUCONAZOLE 150 MG PO TABS: 150.0000 mg | ORAL_TABLET | ORAL | 0 refills | Status: AC

## 2018-03-10 MED ORDER — METRONIDAZOLE 0.75 % VA GEL: 1.0000 | Freq: Every day | VAGINAL | 1 refills | Status: AC

## 2018-03-10 NOTE — Progress Notes (Signed)
Vaginal irritation. Some itching.

## 2018-03-10 NOTE — Progress Notes (Signed)
Routine Prenatal Care Visit  Subjective  Autumn Mullen is a 28 y.o. G2P1001 at [redacted]w[redacted]d being seen today for ongoing prenatal care.  She is currently monitored for the following issues for this low-risk pregnancy and has Supervision of other normal pregnancy, antepartum on their problem list.  ----------------------------------------------------------------------------------- Patient reports no complaints.  Vaginal swelling of labia after shaving. Some itching and white discharge when she pees, no vaginal odor.  Recently changed soap. Contractions: Not present. Vag. Bleeding: None.  Movement: Present. Denies leaking of fluid.  ----------------------------------------------------------------------------------- The following portions of the patient's history were reviewed and updated as appropriate: allergies, current medications, past family history, past medical history, past social history, past surgical history and problem list. Problem list updated.   Objective  Blood pressure 114/68, weight 120 lb (54.4 kg), last menstrual period 10/01/2017. Pregravid weight 112 lb (50.8 kg) Total Weight Gain 8 lb (3.629 kg) Urinalysis:      Fetal Status: Fetal Heart Rate (bpm): 145   Movement: Present     General:  Alert, oriented and cooperative. Patient is in no acute distress.  Skin: Skin is warm and dry. No rash noted.   Cardiovascular: Normal heart rate noted  Respiratory: Normal respiratory effort, no problems with respiration noted  Abdomen: Soft, gravid, appropriate for gestational age. Pain/Pressure: Absent     Pelvic:  Cervical exam performed Dilation: Closed Effacement (%): 0 Station: -3  Extremities: Normal range of motion.  Edema: None  Mental Status: Normal mood and affect. Normal behavior. Normal judgment and thought content.   Wet Prep: Clue Cells: Positive Fungal elements: Positive Trichomonas: Negative  Copious white/yellow discharge clinging to the vaginal  walls.   Assessment   28 y.o. G2P1001 at [redacted]w[redacted]d by  07/08/2018, by Last Menstrual Period presenting for routine prenatal visit  Plan   pregnancy Problems (from 11/26/17 to present)    Problem Noted Resolved   Supervision of other normal pregnancy, antepartum 11/26/2017 by Tresea Mall, CNM No   Overview Addendum 02/24/2018 10:21 AM by Oswaldo Conroy, CNM    [facility] Prenatal Labs  Dating  Blood type: B/Positive/-- (12/02 1100)   Genetic Screen 1 Screen: negative    Antibody:Negative (12/02 1100)  Anatomic [place] Incomplete- girl Rubella: 2.56 (12/02 1100) Varicella: Immune  GTT  Third trimester:  RPR: Non Reactive (12/02 1100)   Rhogam  not needed HBsAg: Negative (12/02 1100)   TDaP vaccine   Flu Shot: 02/24/2018 HIV: Non Reactive (12/02 1100)   Baby Food                                GBS:   Contraception  Pap:2017 NIL  CBB     CS/VBAC NA   Support Person Terrius              Gestational age appropriate obstetric precautions including but not limited to vaginal bleeding, contractions, leaking of fluid and fetal movement were reviewed in detail with the patient.    Will treat for Yeast and BV. Nusab sent for confirmation.  Advised not to use soap on vaginal area.   More than 15 minutes were spent face to face with the patient in the room with more than 50% of the time spent providing counseling and discussing the plan of management.    Return in about 2 weeks (around 03/24/2018) for As planned for ROB.  Natale Milch MD [facility], [facility]  03/10/2018, 9:03 AM

## 2018-03-14 LAB — NUSWAB VAGINITIS PLUS (VG+)
Candida albicans, NAA: POSITIVE — AB
Candida glabrata, NAA: NEGATIVE
Chlamydia trachomatis, NAA: NEGATIVE
Neisseria gonorrhoeae, NAA: NEGATIVE
Trich vag by NAA: NEGATIVE

## 2018-03-20 NOTE — Progress Notes (Signed)
Called, patient has been feeling better, but still has complaints of pressure. Reports that itching is improved. Is taking Flagyl and BV

## 2018-03-24 ENCOUNTER — Ambulatory Visit (INDEPENDENT_AMBULATORY_CARE_PROVIDER_SITE_OTHER): Payer: Commercial Managed Care - PPO | Admitting: Advanced Practice Midwife

## 2018-03-24 ENCOUNTER — Encounter: Payer: Self-pay | Admitting: Advanced Practice Midwife

## 2018-03-24 VITALS — BP 110/60 | Wt 120.0 lb

## 2018-03-24 DIAGNOSIS — Z113 Encounter for screening for infections with a predominantly sexual mode of transmission: Secondary | ICD-10-CM

## 2018-03-24 DIAGNOSIS — O99019 Anemia complicating pregnancy, unspecified trimester: Secondary | ICD-10-CM

## 2018-03-24 DIAGNOSIS — O99012 Anemia complicating pregnancy, second trimester: Secondary | ICD-10-CM

## 2018-03-24 DIAGNOSIS — Z130 Encounter for screening for diseases of the blood and blood-forming organs and certain disorders involving the immune mechanism: Secondary | ICD-10-CM

## 2018-03-24 DIAGNOSIS — D509 Iron deficiency anemia, unspecified: Secondary | ICD-10-CM

## 2018-03-24 DIAGNOSIS — Z3480 Encounter for supervision of other normal pregnancy, unspecified trimester: Secondary | ICD-10-CM

## 2018-03-24 DIAGNOSIS — Z3A24 24 weeks gestation of pregnancy: Secondary | ICD-10-CM

## 2018-03-24 DIAGNOSIS — Z131 Encounter for screening for diabetes mellitus: Secondary | ICD-10-CM

## 2018-03-24 NOTE — Progress Notes (Signed)
ROB C/o some pelvic pressure

## 2018-03-24 NOTE — Progress Notes (Signed)
  Routine Prenatal Care Visit  Subjective  Autumn Mullen is a 28 y.o. G2P1001 at [redacted]w[redacted]d being seen today for ongoing prenatal care.  She is currently monitored for the following issues for this low-risk pregnancy and has Supervision of other normal pregnancy, antepartum on their problem list.  ----------------------------------------------------------------------------------- Patient reports low backache and abdominal pressure after working all day.   Contractions: Not present. Vag. Bleeding: None.  Movement: Present. Denies leaking of fluid.  ----------------------------------------------------------------------------------- The following portions of the patient's history were reviewed and updated as appropriate: allergies, current medications, past family history, past medical history, past social history, past surgical history and problem list. Problem list updated.   Objective  Blood pressure 110/60, weight 120 lb (54.4 kg), last menstrual period 10/01/2017. Pregravid weight 112 lb (50.8 kg) Total Weight Gain 8 lb (3.629 kg) Urinalysis: Urine Protein    Urine Glucose    Fetal Status: Fetal Heart Rate (bpm): 149 Fundal Height: 23 cm Movement: Present     General:  Alert, oriented and cooperative. Patient is in no acute distress.  Skin: Skin is warm and dry. No rash noted.   Cardiovascular: Normal heart rate noted  Respiratory: Normal respiratory effort, no problems with respiration noted  Abdomen: Soft, gravid, appropriate for gestational age. Pain/Pressure: Present     Pelvic:  Cervical exam deferred        Extremities: Normal range of motion.  Edema: None  Mental Status: Normal mood and affect. Normal behavior. Normal judgment and thought content.   Assessment   28 y.o. G2P1001 at [redacted]w[redacted]d by  07/08/2018, by Last Menstrual Period presenting for routine prenatal visit  Plan   pregnancy Problems (from 11/26/17 to present)    Problem Noted Resolved   Supervision of other normal  pregnancy, antepartum 11/26/2017 by Tresea Mall, CNM No   Overview Addendum 02/24/2018 10:21 AM by Oswaldo Conroy, CNM    [facility] Prenatal Labs  Dating  Blood type: B/Positive/-- (12/02 1100)   Genetic Screen 1 Screen: negative    Antibody:Negative (12/02 1100)  Anatomic [place] Incomplete- girl Rubella: 2.56 (12/02 1100) Varicella: Immune  GTT  Third trimester:  RPR: Non Reactive (12/02 1100)   Rhogam  not needed HBsAg: Negative (12/02 1100)   TDaP vaccine   Flu Shot: 02/24/2018 HIV: Non Reactive (12/02 1100)   Baby Food                                GBS:   Contraception  Pap:2017 NIL  CBB     CS/VBAC NA   Support Person Terrius             Reviewed comfort measures for abdominal/back pressure including rest and belly band, rest/sleep in pregnancy, diet and the need for iron-rich foods and calorie-dense food for both fetal well-being and her postpartum needs.  Continue daily PNV with Fe, additional iron, Vitamin C rich foods.  Colace prn.  CBC today for anemia f/u.    Preterm labor symptoms and general obstetric precautions including but not limited to vaginal bleeding, contractions, leaking of fluid and fetal movement were reviewed in detail with the patient.  Please refer to After Visit Summary for other counseling recommendations: anemia in pregnancy, iron rich foods  Return in about 4 weeks (around 04/21/2018) for 28 wk labs and rob.  Tresea Mall, CNM 03/24/2018 10:41 AM

## 2018-03-24 NOTE — Patient Instructions (Signed)
Iron-Rich Diet  Iron is a mineral that helps your body to produce hemoglobin. Hemoglobin is a protein in red blood cells that carries oxygen to your body's tissues. Eating too little iron may cause you to feel weak and tired, and it can increase your risk of infection. Iron is naturally found in many foods, and many foods have iron added to them (iron-fortified foods). You may need to follow an iron-rich diet if you do not have enough iron in your body due to certain medical conditions. The amount of iron that you need each day depends on your age, your sex, and any medical conditions you have. Follow instructions from your health care provider or a diet and nutrition specialist (dietitian) about how much iron you should eat each day. What are tips for following this plan? Reading food labels  Check food labels to see how many milligrams (mg) of iron are in each serving. Cooking  Cook foods in pots and pans that are made from iron.  Take these steps to make it easier for your body to absorb iron from certain foods: ? Soak beans overnight before cooking. ? Soak whole grains overnight and drain them before using. ? Ferment flours before baking, such as by using yeast in bread dough. Meal planning  When you eat foods that contain iron, you should eat them with foods that are high in vitamin C. These include oranges, peppers, tomatoes, potatoes, and mango. Vitamin C helps your body to absorb iron. General information  Take iron supplements only as told by your health care provider. An overdose of iron can be life-threatening. If you were prescribed iron supplements, take them with orange juice or a vitamin C supplement.  When you eat iron-fortified foods or take an iron supplement, you should also eat foods that naturally contain iron, such as meat, poultry, and fish. Eating naturally iron-rich foods helps your body to absorb the iron that is added to other foods or contained in a  supplement.  Certain foods and drinks prevent your body from absorbing iron properly. Avoid eating these foods in the same meal as iron-rich foods or with iron supplements. These foods include: ? Coffee, black tea, and red wine. ? Milk, dairy products, and foods that are high in calcium. ? Beans and soybeans. ? Whole grains. What foods should I eat? Fruits Prunes. Raisins. Eat fruits high in vitamin C, such as oranges, grapefruits, and strawberries, alongside iron-rich foods. Vegetables Spinach (cooked). Green peas. Broccoli. Fermented vegetables. Eat vegetables high in vitamin C, such as leafy greens, potatoes, bell peppers, and tomatoes, alongside iron-rich foods. Grains Iron-fortified breakfast cereal. Iron-fortified whole-wheat bread. Enriched rice. Sprouted grains. Meats and other proteins Beef liver. Oysters. Beef. Shrimp. Turkey. Chicken. Tuna. Sardines. Chickpeas. Nuts. Tofu. Pumpkin seeds. Beverages Tomato juice. Fresh orange juice. Prune juice. Hibiscus tea. Fortified instant breakfast shakes. Sweets and desserts Blackstrap molasses. Seasonings and condiments Tahini. Fermented soy sauce. Other foods Wheat germ. The items listed above may not be a complete list of recommended foods and beverages. Contact a dietitian for more information. What foods should I avoid? Grains Whole grains. Bran cereal. Bran flour. Oats. Meats and other proteins Soybeans. Products made from soy protein. Black beans. Lentils. Mung beans. Split peas. Dairy Milk. Cream. Cheese. Yogurt. Cottage cheese. Beverages Coffee. Black tea. Red wine. Sweets and desserts Cocoa. Chocolate. Ice cream. Other foods Basil. Oregano. Large amounts of parsley. The items listed above may not be a complete list of foods and beverages to avoid.   Contact a dietitian for more information. Summary  Iron is a mineral that helps your body to produce hemoglobin. Hemoglobin is a protein in red blood cells that carries  oxygen to your body's tissues.  Iron is naturally found in many foods, and many foods have iron added to them (iron-fortified foods).  When you eat foods that contain iron, you should eat them with foods that are high in vitamin C. Vitamin C helps your body to absorb iron.  Certain foods and drinks prevent your body from absorbing iron properly, such as whole grains and dairy products. You should avoid eating these foods in the same meal as iron-rich foods or with iron supplements. This information is not intended to replace advice given to you by your health care provider. Make sure you discuss any questions you have with your health care provider. Document Released: 08/29/2004 Document Revised: 12/11/2016 Document Reviewed: 12/11/2016 Elsevier Interactive Patient Education  2019 [employer] Pregnancy and Anemia  Anemia is a condition in which the concentration of red blood cells, or hemoglobin, in the blood is below normal. Hemoglobin is a substance in red blood cells that carries oxygen to the tissues of the body. Anemia results when enough oxygen does not reach these tissues. Anemia is common during pregnancy because the woman's body needs more blood volume and blood cells to provide nutrition to the fetus. The fetus needs iron and folic acid as it is developing. Your body may not produce enough red blood cells because of this. Also, during pregnancy, the liquid part of the blood (plasma) increases by about 30-50%, and the red blood cells increase by only 20%. This lowers the concentration of the red blood cells and creates a natural anemia-like situation. What are the causes? The most common cause of anemia during pregnancy is not having enough iron in the body to make red blood cells (iron deficiency anemia). Other causes may include:  Folic acid deficiency.  Vitamin B12 deficiency.  Certain prescription or over-the-counter medicines.  Certain medical conditions or infections that  destroy red blood cells.  A low platelet count and bleeding caused by antibodies that go through the placenta to the fetus from the mother's blood. What are the signs or symptoms? Mild anemia may not be noticeable. If it becomes severe, symptoms may include:  Feeling tired (fatigue).  Shortness of breath, especially during activity.  Weakness.  Fainting.  Pale looking skin.  Headaches.  A fast or irregular heartbeat (palpitations).  Dizziness. How is this diagnosed? This condition may be diagnosed based on:  Your medical history and a physical exam.  Blood tests. How is this treated? Treatment for anemia during pregnancy depends on the cause of the anemia. Treatment can include:  Dietary changes.  Supplements of iron, vitamin B12, or folic acid.  A blood transfusion. This may be needed if anemia is severe.  Hospitalization. This may be needed if there is a lot of blood loss or severe anemia. Follow these instructions at home:  Follow recommendations from your dietitian or health care provider about changing your diet.  Increase your vitamin C intake. This will help the stomach absorb more iron. Some foods that are high in vitamin C include: ? Oranges. ? Peppers. ? Tomatoes. ? Mangoes.  Eat a diet rich in iron. This would include foods such as: ? Liver. ? Beef. ? Eggs. ? Whole grains. ? Spinach. ? Dried fruit.  Take iron and vitamins as told by your health care provider.  Eat green  leafy vegetables. These are a good source of folic acid.  Keep all follow-up visits as told by your health care provider. This is important. Contact a health care provider if:  You have frequent or lasting headaches.  You look pale.  You bruise easily. Get help right away if:  You have extreme weakness, shortness of breath, or chest pain.  You become dizzy or have trouble concentrating.  You have heavy vaginal bleeding.  You develop a rash.  You have bloody or  black, tarry stools.  You faint.  You vomit up blood.  You vomit repeatedly.  You have abdominal pain.  You have a fever.  You are dehydrated. Summary  Anemia is a condition in which the concentration of red blood cells or hemoglobin in the blood is below normal.  Anemia is common during pregnancy because the woman's body needs more blood volume and blood cells to provide nutrition to the fetus.  The most common cause of anemia during pregnancy is not having enough iron in the body to make red blood cells (iron deficiency anemia).  Mild anemia may not be noticeable. If it becomes severe, symptoms may include feeling tired and weak. This information is not intended to replace advice given to you by your health care provider. Make sure you discuss any questions you have with your health care provider. Document Released: 01/13/2000 Document Revised: 02/21/2016 Document Reviewed: 02/21/2016 Elsevier Interactive Patient Education  2019 [employer].

## 2018-03-25 LAB — CBC WITH DIFFERENTIAL/PLATELET
Basophils Absolute: 0 10*3/uL (ref 0.0–0.2)
Basos: 0 %
EOS (ABSOLUTE): 0.1 10*3/uL (ref 0.0–0.4)
Eos: 1 %
Hematocrit: 30.9 % — ABNORMAL LOW (ref 34.0–46.6)
Hemoglobin: 10.2 g/dL — ABNORMAL LOW (ref 11.1–15.9)
IMMATURE GRANS (ABS): 0.2 10*3/uL — AB (ref 0.0–0.1)
Immature Granulocytes: 1 %
Lymphocytes Absolute: 2 10*3/uL (ref 0.7–3.1)
Lymphs: 18 %
MCH: 26.4 pg — AB (ref 26.6–33.0)
MCHC: 33 g/dL (ref 31.5–35.7)
MCV: 80 fL (ref 79–97)
Monocytes Absolute: 0.5 10*3/uL (ref 0.1–0.9)
Monocytes: 4 %
Neutrophils Absolute: 8.2 10*3/uL — ABNORMAL HIGH (ref 1.4–7.0)
Neutrophils: 76 %
Platelets: 165 10*3/uL (ref 150–450)
RBC: 3.87 x10E6/uL (ref 3.77–5.28)
RDW: 17.1 % — ABNORMAL HIGH (ref 11.7–15.4)
WBC: 11 10*3/uL — ABNORMAL HIGH (ref 3.4–10.8)

## 2018-04-21 ENCOUNTER — Ambulatory Visit (INDEPENDENT_AMBULATORY_CARE_PROVIDER_SITE_OTHER): Payer: Commercial Managed Care - PPO | Admitting: Obstetrics and Gynecology

## 2018-04-21 ENCOUNTER — Encounter: Payer: Self-pay | Admitting: Obstetrics and Gynecology

## 2018-04-21 ENCOUNTER — Other Ambulatory Visit: Payer: Self-pay

## 2018-04-21 ENCOUNTER — Other Ambulatory Visit: Payer: Commercial Managed Care - PPO

## 2018-04-21 VITALS — BP 114/74 | Wt 126.0 lb

## 2018-04-21 DIAGNOSIS — Z3480 Encounter for supervision of other normal pregnancy, unspecified trimester: Secondary | ICD-10-CM

## 2018-04-21 DIAGNOSIS — Z113 Encounter for screening for infections with a predominantly sexual mode of transmission: Secondary | ICD-10-CM

## 2018-04-21 DIAGNOSIS — Z131 Encounter for screening for diabetes mellitus: Secondary | ICD-10-CM

## 2018-04-21 DIAGNOSIS — Z3A28 28 weeks gestation of pregnancy: Secondary | ICD-10-CM

## 2018-04-21 DIAGNOSIS — Z130 Encounter for screening for diseases of the blood and blood-forming organs and certain disorders involving the immune mechanism: Secondary | ICD-10-CM

## 2018-04-21 NOTE — Progress Notes (Signed)
  Routine Prenatal Care Visit  Subjective  Autumn Mullen is a 28 y.o. G2P1001 at [redacted]w[redacted]d being seen today for ongoing prenatal care.  She is currently monitored for the following issues for this low-risk pregnancy and has Supervision of other normal pregnancy, antepartum on their problem list.  ----------------------------------------------------------------------------------- Patient reports no complaints.   Contractions: Not present. Vag. Bleeding: None.  Movement: Present. Denies leaking of fluid.  ----------------------------------------------------------------------------------- The following portions of the patient's history were reviewed and updated as appropriate: allergies, current medications, past family history, past medical history, past social history, past surgical history and problem list. Problem list updated.   Objective  Blood pressure 114/74, weight 126 lb (57.2 kg), last menstrual period 10/01/2017. Pregravid weight 112 lb (50.8 kg) Total Weight Gain 14 lb (6.35 kg) Urinalysis: Urine Protein    Urine Glucose    Fetal Status: Fetal Heart Rate (bpm): 145 Fundal Height: 28 cm Movement: Present     General:  Alert, oriented and cooperative. Patient is in no acute distress.  Skin: Skin is warm and dry. No rash noted.   Cardiovascular: Normal heart rate noted  Respiratory: Normal respiratory effort, no problems with respiration noted  Abdomen: Soft, gravid, appropriate for gestational age. Pain/Pressure: Absent     Pelvic:  Cervical exam deferred        Extremities: Normal range of motion.  Edema: None  Mental Status: Normal mood and affect. Normal behavior. Normal judgment and thought content.   Assessment   28 y.o. G2P1001 at [redacted]w[redacted]d by  07/08/2018, by Last Menstrual Period presenting for routine prenatal visit  Plan   pregnancy Problems (from 11/26/17 to present)    Problem Noted Resolved   Supervision of other normal pregnancy, antepartum 11/26/2017 by Tresea Mall,  CNM No   Overview Addendum 02/24/2018 10:21 AM by Oswaldo Conroy, CNM    [facility]inic [facility] Prenatal Labs  Dating  Blood type: B/Positive/-- (12/02 1100)   Genetic Screen 1 Screen: negative    Antibody:Negative (12/02 1100)  Anatomic [place] Incomplete- girl Rubella: 2.56 (12/02 1100) Varicella: Immune  GTT  Third trimester:  RPR: Non Reactive (12/02 1100)   Rhogam  not needed HBsAg: Negative (12/02 1100)   TDaP vaccine   Flu Shot: 02/24/2018 HIV: Non Reactive (12/02 1100)   Baby Food                                GBS:   Contraception  Pap:2017 NIL  CBB     CS/VBAC NA   Support Person Terrius              Preterm labor symptoms and general obstetric precautions including but not limited to vaginal bleeding, contractions, leaking of fluid and fetal movement were reviewed in detail with the patient. Please refer to After Visit Summary for other counseling recommendations.   Return in about 2 weeks (around 05/05/2018) for ROB via telephone.  Thomasene Mohair, MD, [facility], [facility] 04/21/2018 10:17 AM

## 2018-04-22 LAB — 28 WEEK RH+PANEL
BASOS: 0 %
Basophils Absolute: 0 10*3/uL (ref 0.0–0.2)
EOS (ABSOLUTE): 0.1 10*3/uL (ref 0.0–0.4)
Eos: 1 %
Gestational Diabetes Screen: 114 mg/dL (ref 65–139)
HEMOGLOBIN: 11.4 g/dL (ref 11.1–15.9)
HIV Screen 4th Generation wRfx: NONREACTIVE
Hematocrit: 35.6 % (ref 34.0–46.6)
Immature Grans (Abs): 0.2 10*3/uL — ABNORMAL HIGH (ref 0.0–0.1)
Immature Granulocytes: 2 %
Lymphocytes Absolute: 1.4 10*3/uL (ref 0.7–3.1)
Lymphs: 11 %
MCH: 25.9 pg — ABNORMAL LOW (ref 26.6–33.0)
MCHC: 32 g/dL (ref 31.5–35.7)
MCV: 81 fL (ref 79–97)
Monocytes Absolute: 0.6 10*3/uL (ref 0.1–0.9)
Monocytes: 5 %
NEUTROS ABS: 10.5 10*3/uL — AB (ref 1.4–7.0)
Neutrophils: 81 %
Platelets: 163 10*3/uL (ref 150–450)
RBC: 4.4 x10E6/uL (ref 3.77–5.28)
RDW: 16.5 % — ABNORMAL HIGH (ref 11.7–15.4)
RPR Ser Ql: NONREACTIVE
WBC: 12.9 10*3/uL — ABNORMAL HIGH (ref 3.4–10.8)

## 2018-05-02 ENCOUNTER — Telehealth: Payer: Self-pay

## 2018-05-02 NOTE — Telephone Encounter (Signed)
Pt states she works at [employer]. 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 She would like for Erskine Squibb to call her at [phone]

## 2018-05-05 ENCOUNTER — Encounter: Payer: Self-pay | Admitting: Obstetrics and Gynecology

## 2018-05-05 ENCOUNTER — Other Ambulatory Visit: Payer: Self-pay

## 2018-05-05 ENCOUNTER — Ambulatory Visit (INDEPENDENT_AMBULATORY_CARE_PROVIDER_SITE_OTHER): Payer: Commercial Managed Care - PPO | Admitting: Obstetrics and Gynecology

## 2018-05-05 DIAGNOSIS — Z3A30 30 weeks gestation of pregnancy: Secondary | ICD-10-CM

## 2018-05-05 DIAGNOSIS — Z3480 Encounter for supervision of other normal pregnancy, unspecified trimester: Secondary | ICD-10-CM

## 2018-05-05 DIAGNOSIS — Z3483 Encounter for supervision of other normal pregnancy, third trimester: Secondary | ICD-10-CM

## 2018-05-05 NOTE — Patient Instructions (Signed)
   Hello,  Given the current COVID-19 pandemic, our practice is making changes in how we are providing care to our patients. We are limiting in-person visits for the safety of all of our patients.   As a practice, we have met to discuss the best way to minimize visits, but still provide excellent care to our expecting mothers.  We have decided on the following visit structure for low-risk pregnancies.  Initial Pregnancy visit will be conducted as a telephone or web visit.  Between 10-14 weeks  there will be one in-person visit for an ultrasound, lab work, and genetic screening. 20 weeks in-person visit with an anatomy ultrasound  28 weeks in-person office visit for a 1-hour glucose test and a TDAP vaccination 32 weeks in-person office visit 34 weeks telephone visit 36 weeks in-person office visit for GBS, chlamydia, and gonorrhea testing 38 weeks in-person office visit 40 weeks in-person office visit  Understandably, some patients will require more visits than what is outlined above. Additional visits will be determined on a case-by-case basis.   We will, as always, be available for emergencies or to address concerns that might arise between in-person visits. We ask that you allow us the opportunity to address any concerns over the phone or through a virtual visit first. We will be available to return your phone calls throughout the day.   If you are able to purchase a scale, a blood pressure machine, and a home fetal doppler visits could be limited further. This will help decrease your exposure risks, but these purchases are not a necessity.   Things seem to change daily and there is the possibility that this structure could change, please be patient as we adapt to a new way of caring for patients.   Thank you for trusting us with your prenatal care. Our practice values you and looks forward to providing you with excellent care.   Sincerely,   [facility], [facility]   

## 2018-05-05 NOTE — Telephone Encounter (Signed)
I sent this to JEG on Friday, I see no response Please advise

## 2018-05-05 NOTE — Telephone Encounter (Signed)
Patient's concerns were addressed by Dr. Jean Rosenthal today during visit.

## 2018-05-05 NOTE — Progress Notes (Signed)
Virtual Visit via Telephone Note  I connected with Autumn Mullen on 05/05/18 at 10:30 AM EDT by telephone and verified that I am speaking with the correct person using two identifiers.   I discussed the limitations, risks, security and privacy concerns of performing an evaluation and management service by telephone and the availability of in person appointments. I also discussed with the patient that there may be a patient responsible charge related to this service. The patient expressed understanding and agreed to proceed.  She was at home and I was in my office.  History of Present Illness: 28 y.o. G73P1001 female at [redacted]w[redacted]d who presents for a telephone prenatal visit.  She notes right-sided back pain that makes it difficult to get up sometimes.  She has been going for walks lately.   She denies VB, no LOF, no contractions. She notes +FM.     Observations/Objective: No exam today, due to telephone eVisit due to [facility] virus restriction on elective visits and procedures.  Prior visits reviewed along with ultrasounds/labs as indicated.  Assessment and Plan: pregnancy Problems (from 11/26/17 to present)    Problem Noted Resolved   Supervision of other normal pregnancy, antepartum 11/26/2017 by Tresea Mall, CNM No   Overview Addendum 02/24/2018 10:21 AM by Oswaldo Conroy, CNM    [facility]inic [facility] Prenatal Labs  Dating  Blood type: B/Positive/-- (12/02 1100)   Genetic Screen 1 Screen: negative    Antibody:Negative (12/02 1100)  Anatomic [place] Incomplete- girl Rubella: 2.56 (12/02 1100) Varicella: Immune  GTT  Third trimester:  RPR: Non Reactive (12/02 1100)   Rhogam  not needed HBsAg: Negative (12/02 1100)   TDaP vaccine   Flu Shot: 02/24/2018 HIV: Non Reactive (12/02 1100)   Baby Food                                GBS:   Contraception  Pap:2017 NIL  CBB     CS/VBAC NA   Support Person Terrius             Follow Up Instructions:    I discussed the assessment and treatment plan with  the patient. The patient was provided an opportunity to ask questions and all were answered. The patient agreed with the plan and demonstrated an understanding of the instructions.   The patient was advised to call back or seek an in-person evaluation if the symptoms worsen or if the condition fails to improve as anticipated.  I provided 16 minutes of non-face-to-face time during this encounter.  Thomasene Mohair, MD, [facility], [facility] 05/05/2018 5:12 PM

## 2018-05-06 ENCOUNTER — Telehealth: Payer: Self-pay

## 2018-05-06 NOTE — Telephone Encounter (Signed)
Letter faxed.

## 2018-05-06 NOTE — Telephone Encounter (Signed)
Pt called; states she and SDJ talked yesterday about a letter for her to go out of work.  Her job doesn't think she should return to work and they need a note from [employer] stating he is taking her out of work.  Pt has fax number.  Pt's # [phone]

## 2018-05-21 ENCOUNTER — Ambulatory Visit (INDEPENDENT_AMBULATORY_CARE_PROVIDER_SITE_OTHER): Payer: Commercial Managed Care - PPO | Admitting: Obstetrics and Gynecology

## 2018-05-21 ENCOUNTER — Encounter: Payer: Self-pay | Admitting: Obstetrics and Gynecology

## 2018-05-21 ENCOUNTER — Other Ambulatory Visit: Payer: Self-pay

## 2018-05-21 VITALS — BP 116/80 | Wt 132.0 lb

## 2018-05-21 DIAGNOSIS — Z3A33 33 weeks gestation of pregnancy: Secondary | ICD-10-CM

## 2018-05-21 DIAGNOSIS — Z3480 Encounter for supervision of other normal pregnancy, unspecified trimester: Secondary | ICD-10-CM

## 2018-05-21 DIAGNOSIS — Z3483 Encounter for supervision of other normal pregnancy, third trimester: Secondary | ICD-10-CM

## 2018-05-21 NOTE — Progress Notes (Signed)
  Routine Prenatal Care Visit  Subjective  Autumn Mullen is a 28 y.o. G2P1001 at [redacted]w[redacted]d being seen today for ongoing prenatal care.  She is currently monitored for the following issues for this low-risk pregnancy and has Supervision of other normal pregnancy, antepartum on their problem list.  ----------------------------------------------------------------------------------- Patient reports no complaints.   Contractions: Not present. Vag. Bleeding: None.  Movement: Present. Denies leaking of fluid.  ----------------------------------------------------------------------------------- The following portions of the patient's history were reviewed and updated as appropriate: allergies, current medications, past family history, past medical history, past social history, past surgical history and problem list. Problem list updated.   Objective  Blood pressure 116/80, weight 132 lb (59.9 kg), last menstrual period 10/01/2017. Pregravid weight 112 lb (50.8 kg) Total Weight Gain 20 lb (9.072 kg) Urinalysis: Urine Protein    Urine Glucose    Fetal Status: Fetal Heart Rate (bpm): 145 Fundal Height: 33 cm Movement: Present     General:  Alert, oriented and cooperative. Patient is in no acute distress.  Skin: Skin is warm and dry. No rash noted.   Cardiovascular: Normal heart rate noted  Respiratory: Normal respiratory effort, no problems with respiration noted  Abdomen: Soft, gravid, appropriate for gestational age. Pain/Pressure: Absent     Pelvic:  Cervical exam deferred        Extremities: Normal range of motion.  Edema: Trace  Mental Status: Normal mood and affect. Normal behavior. Normal judgment and thought content.   Assessment   28 y.o. G2P1001 at [redacted]w[redacted]d by  07/08/2018, by Last Menstrual Period presenting for routine prenatal visit  Plan   pregnancy Problems (from 11/26/17 to present)    Problem Noted Resolved   Supervision of other normal pregnancy, antepartum 11/26/2017 by Tresea Mall,  CNM No   Overview Addendum 02/24/2018 10:21 AM by Oswaldo Conroy, CNM    [facility]inic [facility] Prenatal Labs  Dating  Blood type: B/Positive/-- (12/02 1100)   Genetic Screen 1 Screen: negative    Antibody:Negative (12/02 1100)  Anatomic [place] Incomplete- girl Rubella: 2.56 (12/02 1100) Varicella: Immune  GTT  Third trimester:  RPR: Non Reactive (12/02 1100)   Rhogam  not needed HBsAg: Negative (12/02 1100)   TDaP vaccine   Flu Shot: 02/24/2018 HIV: Non Reactive (12/02 1100)   Baby Food                                GBS:   Contraception  Pap:2017 NIL  CBB     CS/VBAC NA   Support Person Terrius              Preterm labor symptoms and general obstetric precautions including but not limited to vaginal bleeding, contractions, leaking of fluid and fetal movement were reviewed in detail with the patient. Please refer to After Visit Summary for other counseling recommendations.   Return in about 2 weeks (around 06/04/2018) for Routine Prenatal Appointment/telephone.  Thomasene Mohair, MD, [facility], [facility] 05/21/2018 9:56 AM

## 2018-06-04 ENCOUNTER — Other Ambulatory Visit: Payer: Self-pay

## 2018-06-04 ENCOUNTER — Ambulatory Visit (INDEPENDENT_AMBULATORY_CARE_PROVIDER_SITE_OTHER): Payer: Commercial Managed Care - PPO | Admitting: Obstetrics and Gynecology

## 2018-06-04 ENCOUNTER — Encounter: Payer: Self-pay | Admitting: Obstetrics and Gynecology

## 2018-06-04 DIAGNOSIS — Z3483 Encounter for supervision of other normal pregnancy, third trimester: Secondary | ICD-10-CM

## 2018-06-04 DIAGNOSIS — Z3A35 35 weeks gestation of pregnancy: Secondary | ICD-10-CM

## 2018-06-04 DIAGNOSIS — Z3480 Encounter for supervision of other normal pregnancy, unspecified trimester: Secondary | ICD-10-CM

## 2018-06-04 NOTE — Progress Notes (Signed)
Routine Prenatal Care Visit- Virtual Visit  Subjective   Virtual Visit via Telephone Note  I connected with Autumn Mullen  on 06/04/18 at 11:30 AM EDT by telephone and verified that I am speaking with the correct person using two identifiers.   I discussed the limitations, risks, security and privacy concerns of performing an evaluation and management service by telephone and the availability of in person appointments. I also discussed with the patient that there may be a patient responsible charge related to this service. The patient expressed understanding and agreed to proceed.  The patient was at home I spoke with the patient from my  office The names of people involved in this encounter were: Ples Specter and Thomasene Mohair, MD.   Autumn Mullen is a 28 y.o. G2P1001 at [redacted]w[redacted]d being seen today for ongoing prenatal care.  She is currently monitored for the following issues for this low-risk pregnancy and has Supervision of other normal pregnancy, antepartum on their problem list.  ----------------------------------------------------------------------------------- Patient reports no complaints.   Contractions: Irritability. Vag. Bleeding: None.  Movement: Present. Denies leaking of fluid.  ----------------------------------------------------------------------------------- The following portions of the patient's history were reviewed and updated as appropriate: allergies, current medications, past family history, past medical history, past social history, past surgical history and problem list. Problem list updated.   Objective  Last menstrual period 10/01/2017. Pregravid weight 112 lb (50.8 kg) Total Weight Gain 20 lb (9.072 kg) Urinalysis:      Fetal Status:     Movement: Present     Physical Exam could not be performed. Because of the COVID-19 outbreak this visit was performed over the phone and not in person.   Assessment   28 y.o. G2P1001 at [redacted]w[redacted]d by  07/08/2018, by Last Menstrual  Period presenting for routine prenatal visit  Plan   pregnancy Problems (from 11/26/17 to present)    Problem Noted Resolved   Supervision of other normal pregnancy, antepartum 11/26/2017 by Tresea Mall, CNM No   Overview Addendum 06/04/2018 11:53 AM by Conard Novak, MD    [facility] Prenatal Labs  Dating L/9 Blood type: B/Positive/-- (12/02 1100)   Genetic Screen 1 Screen: negative    Antibody:Negative (12/02 1100)  Anatomic [place] complete- girl Rubella: 2.56 (12/02 1100) Varicella: Immune  GTT  Third trimester: 114 RPR: Non Reactive (12/02 1100)   Rhogam  not needed HBsAg: Negative (12/02 1100)   TDaP vaccine   Flu Shot: 02/24/2018 HIV: Non Reactive (12/02 1100)   Baby Food                                GBS:   Contraception  Pap:2017 NIL  CBB     CS/VBAC NA   Support Person Terrius              Gestational age appropriate obstetric precautions including but not limited to vaginal bleeding, contractions, leaking of fluid and fetal movement were reviewed in detail with the patient.     Follow Up Instructions: - Discussed hospital visitation policy while in triage vs. Labor.     I discussed the assessment and treatment plan with the patient. The patient was provided an opportunity to ask questions and all were answered. The patient agreed with the plan and demonstrated an understanding of the instructions.   The patient was advised to call back or seek an in-person evaluation if the symptoms worsen or if the condition  fails to improve as anticipated.  I provided 14 minutes of non-face-to-face time during this encounter.  Return in about 1 week (around 06/11/2018) for Routine Prenatal Appointment.  Thomasene MohairStephen Rino Hosea, MD  [facility], [facility] 06/04/2018 11:58 AM

## 2018-06-06 ENCOUNTER — Telehealth: Payer: Self-pay

## 2018-06-06 NOTE — Telephone Encounter (Signed)
FMLA/DISABILITY form for [employer] filled out, signature obtained, and given to KT for processing. 

## 2018-06-11 ENCOUNTER — Other Ambulatory Visit (HOSPITAL_COMMUNITY)
Admission: RE | Admit: 2018-06-11 | Discharge: 2018-06-11 | Disposition: A | Discharge status: Home / Self Care | Payer: Commercial Managed Care - PPO | Source: Ambulatory Visit | Attending: Advanced Practice Midwife | Admitting: Advanced Practice Midwife

## 2018-06-11 ENCOUNTER — Ambulatory Visit (INDEPENDENT_AMBULATORY_CARE_PROVIDER_SITE_OTHER): Payer: Commercial Managed Care - PPO | Admitting: Advanced Practice Midwife

## 2018-06-11 ENCOUNTER — Other Ambulatory Visit: Payer: Self-pay

## 2018-06-11 ENCOUNTER — Encounter: Payer: Self-pay | Admitting: Advanced Practice Midwife

## 2018-06-11 VITALS — BP 118/76 | Wt 140.0 lb

## 2018-06-11 DIAGNOSIS — Z113 Encounter for screening for infections with a predominantly sexual mode of transmission: Secondary | ICD-10-CM | POA: Diagnosis present

## 2018-06-11 DIAGNOSIS — Z3A36 36 weeks gestation of pregnancy: Secondary | ICD-10-CM

## 2018-06-11 DIAGNOSIS — Z3483 Encounter for supervision of other normal pregnancy, third trimester: Secondary | ICD-10-CM

## 2018-06-11 DIAGNOSIS — Z3685 Encounter for antenatal screening for Streptococcus B: Secondary | ICD-10-CM

## 2018-06-11 DIAGNOSIS — Z3480 Encounter for supervision of other normal pregnancy, unspecified trimester: Secondary | ICD-10-CM

## 2018-06-11 NOTE — Patient Instructions (Signed)
Braxton Hicks Contractions Contractions of the uterus can occur throughout pregnancy, but they are not always a sign that you are in labor. You may have practice contractions called Braxton Hicks contractions. These false labor contractions are sometimes confused with true labor. What are Braxton Hicks contractions? Braxton Hicks contractions are tightening movements that occur in the muscles of the uterus before labor. Unlike true labor contractions, these contractions do not result in opening (dilation) and thinning of the cervix. Toward the end of pregnancy (32-34 weeks), Braxton Hicks contractions can happen more often and may become stronger. These contractions are sometimes difficult to tell apart from true labor because they can be very uncomfortable. You should not feel embarrassed if you go to the hospital with false labor. Sometimes, the only way to tell if you are in true labor is for your health care provider to look for changes in the cervix. The health care provider will do a physical exam and may monitor your contractions. If you are not in true labor, the exam should show that your cervix is not dilating and your water has not broken. If there are no other health problems associated with your pregnancy, it is completely safe for you to be sent home with false labor. You may continue to have Braxton Hicks contractions until you go into true labor. How to tell the difference between true labor and false labor True labor  Contractions last 30-70 seconds.  Contractions become very regular.  Discomfort is usually felt in the top of the uterus, and it spreads to the lower abdomen and low back.  Contractions do not go away with walking.  Contractions usually become more intense and increase in frequency.  The cervix dilates and gets thinner. False labor  Contractions are usually shorter and not as strong as true labor contractions.  Contractions are usually irregular.  Contractions  are often felt in the front of the lower abdomen and in the groin.  Contractions may go away when you walk around or change positions while lying down.  Contractions get weaker and are shorter-lasting as time goes on.  The cervix usually does not dilate or become thin. Follow these instructions at home:   Take over-the-counter and prescription medicines only as told by your health care provider.  Keep up with your usual exercises and follow other instructions from your health care provider.  Eat and drink lightly if you think you are going into labor.  If Braxton Hicks contractions are making you uncomfortable: ? Change your position from lying down or resting to walking, or change from walking to resting. ? Sit and rest in a tub of warm water. ? Drink enough fluid to keep your urine pale yellow. Dehydration may cause these contractions. ? Do slow and deep breathing several times an hour.  Keep all follow-up prenatal visits as told by your health care provider. This is important. Contact a health care provider if:  You have a fever.  You have continuous pain in your abdomen. Get help right away if:  Your contractions become stronger, more regular, and closer together.  You have fluid leaking or gushing from your vagina.  You pass blood-tinged mucus (bloody show).  You have bleeding from your vagina.  You have low back pain that you never had before.  You feel your baby's head pushing down and causing pelvic pressure.  Your baby is not moving inside you as much as it used to. Summary  Contractions that occur before labor are   called Braxton Hicks contractions, false labor, or practice contractions.  Braxton Hicks contractions are usually shorter, weaker, farther apart, and less regular than true labor contractions. True labor contractions usually become progressively stronger and regular, and they become more frequent.  Manage discomfort from Braxton Hicks contractions  by changing position, resting in a warm bath, drinking plenty of water, or practicing deep breathing. This information is not intended to replace advice given to you by your health care provider. Make sure you discuss any questions you have with your health care provider. Document Released: 05/31/2016 Document Revised: 10/30/2016 Document Reviewed: 05/31/2016 Elsevier Interactive Patient Education  2019 Elsevier Inc.  

## 2018-06-11 NOTE — Progress Notes (Signed)
GBS/Aptima today. No vb. No lof.  

## 2018-06-11 NOTE — Progress Notes (Signed)
  Routine Prenatal Care Visit  Subjective  Autumn Mullen is a 28 y.o. G2P1001 at 103w1d being seen today for ongoing prenatal care.  She is currently monitored for the following issues for this low-risk pregnancy and has Supervision of other normal pregnancy, antepartum on their problem list.  ----------------------------------------------------------------------------------- Patient reports some back pain- reviewed comfort measures. She has had a few contractions recently but nothing regular.   Contractions: Not present. Vag. Bleeding: None.  Movement: Present. Denies leaking of fluid.  ----------------------------------------------------------------------------------- The following portions of the patient's history were reviewed and updated as appropriate: allergies, current medications, past family history, past medical history, past social history, past surgical history and problem list. Problem list updated.   Objective  Blood pressure 118/76, weight 140 lb (63.5 kg), last menstrual period 10/01/2017. Pregravid weight 112 lb (50.8 kg) Total Weight Gain 28 lb (12.7 kg) Urinalysis: Urine Protein    Urine Glucose    Fetal Status: Fetal Heart Rate (bpm): 150 Fundal Height: 36 cm Movement: Present  Presentation: Vertex  General:  Alert, oriented and cooperative. Patient is in no acute distress.  Skin: Skin is warm and dry. No rash noted.   Cardiovascular: Normal heart rate noted  Respiratory: Normal respiratory effort, no problems with respiration noted  Abdomen: Soft, gravid, appropriate for gestational age. Pain/Pressure: Present     Pelvic:  Cervical exam performed Dilation: 1.5 Effacement (%): 40 Station: -3  Extremities: Normal range of motion.  Edema: None  Mental Status: Normal mood and affect. Normal behavior. Normal judgment and thought content.   Assessment   28 y.o. G2P1001 at [redacted]w[redacted]d by  07/08/2018, by Last Menstrual Period presenting for routine prenatal visit  Plan   pregnancy  Problems (from 11/26/17 to present)    Problem Noted Resolved   Supervision of other normal pregnancy, antepartum 11/26/2017 by Tresea Mall, CNM No   Overview Addendum 06/04/2018 11:53 AM by Conard Novak, MD    [facility]inic [facility] Prenatal Labs  Dating L/9 Blood type: B/Positive/-- (12/02 1100)   Genetic Screen 1 Screen: negative    Antibody:Negative (12/02 1100)  Anatomic [place] complete- girl Rubella: 2.56 (12/02 1100) Varicella: Immune  GTT  Third trimester: 114 RPR: Non Reactive (12/02 1100)   Rhogam  not needed HBsAg: Negative (12/02 1100)   TDaP vaccine   Flu Shot: 02/24/2018 HIV: Non Reactive (12/02 1100)   Baby Food Breast- discussed latch, supply issues. She has pump.                               GBS:   Contraception POP Pap:2017 NIL  CBB     CS/VBAC NA   Support Person Terrius              Preterm labor symptoms and general obstetric precautions including but not limited to vaginal bleeding, contractions, leaking of fluid and fetal movement were reviewed in detail with the patient. Please refer to After Visit Summary for other counseling recommendations.   Return in about 2 weeks (around 06/25/2018) for rob.  Tresea Mall, CNM 06/11/2018 11:57 AM

## 2018-06-13 LAB — CERVICOVAGINAL ANCILLARY ONLY
Chlamydia: NEGATIVE
Neisseria Gonorrhea: NEGATIVE
Trichomonas: NEGATIVE

## 2018-06-13 LAB — STREP GP B NAA: Strep Gp B NAA: NEGATIVE

## 2018-06-25 ENCOUNTER — Encounter: Payer: Self-pay | Admitting: Obstetrics and Gynecology

## 2018-06-25 ENCOUNTER — Ambulatory Visit (INDEPENDENT_AMBULATORY_CARE_PROVIDER_SITE_OTHER): Payer: Commercial Managed Care - PPO | Admitting: Obstetrics and Gynecology

## 2018-06-25 ENCOUNTER — Other Ambulatory Visit: Payer: Self-pay

## 2018-06-25 VITALS — BP 122/74 | Wt 146.0 lb

## 2018-06-25 DIAGNOSIS — Z3A38 38 weeks gestation of pregnancy: Secondary | ICD-10-CM

## 2018-06-25 DIAGNOSIS — Z3480 Encounter for supervision of other normal pregnancy, unspecified trimester: Secondary | ICD-10-CM

## 2018-06-25 DIAGNOSIS — Z3483 Encounter for supervision of other normal pregnancy, third trimester: Secondary | ICD-10-CM

## 2018-06-25 NOTE — Progress Notes (Signed)
  Routine Prenatal Care Visit  Subjective  Autumn Mullen is a 28 y.o. G2P1001 at [redacted]w[redacted]d being seen today for ongoing prenatal care.  She is currently monitored for the following issues for this low-risk pregnancy and has Supervision of other normal pregnancy, antepartum on their problem list.  ----------------------------------------------------------------------------------- Patient reports no complaints.   Contractions: Irritability. Vag. Bleeding: None.  Movement: Present. Denies leaking of fluid.  ----------------------------------------------------------------------------------- The following portions of the patient's history were reviewed and updated as appropriate: allergies, current medications, past family history, past medical history, past social history, past surgical history and problem list. Problem list updated.   Objective  Blood pressure 122/74, weight 146 lb (66.2 kg), last menstrual period 10/01/2017. Pregravid weight 112 lb (50.8 kg) Total Weight Gain 34 lb (15.4 kg) Urinalysis: Urine Protein    Urine Glucose    Fetal Status: Fetal Heart Rate (bpm): 140 Fundal Height: 38 cm Movement: Present  Presentation: Vertex  General:  Alert, oriented and cooperative. Patient is in no acute distress.  Skin: Skin is warm and dry. No rash noted.   Cardiovascular: Normal heart rate noted  Respiratory: Normal respiratory effort, no problems with respiration noted  Abdomen: Soft, gravid, appropriate for gestational age. Pain/Pressure: Present     Pelvic:  Cervical exam deferred        Extremities: Normal range of motion.  Edema: None  Mental Status: Normal mood and affect. Normal behavior. Normal judgment and thought content.   Assessment   28 y.o. G2P1001 at [redacted]w[redacted]d by  07/08/2018, by Last Menstrual Period presenting for routine prenatal visit  Plan   pregnancy Problems (from 11/26/17 to present)    Problem Noted Resolved   Supervision of other normal pregnancy, antepartum 11/26/2017  by Tresea Mall, CNM No   Overview Addendum 06/11/2018 12:01 PM by Tresea Mall, CNM    [facility]inic [facility] Prenatal Labs  Dating L/9 Blood type: B/Positive/-- (12/02 1100)   Genetic Screen 1 Screen: negative    Antibody:Negative (12/02 1100)  Anatomic [place] complete- girl Rubella: 2.56 (12/02 1100) Varicella: Immune  GTT  Third trimester: 114 RPR: Non Reactive (12/02 1100)   Rhogam  not needed HBsAg: Negative (12/02 1100)   TDaP vaccine   Flu Shot: 02/24/2018 HIV: Non Reactive (12/02 1100)   Baby Food Breast                               GBS:   Contraception POP Pap:2017 NIL  CBB     CS/VBAC NA   Support Person Terrius              Term labor symptoms and general obstetric precautions including but not limited to vaginal bleeding, contractions, leaking of fluid and fetal movement were reviewed in detail with the patient. Please refer to After Visit Summary for other counseling recommendations.   - declines cervical check - discussed IOL versus expectant management. Elects expectant management at this time.   Return in about 1 week (around 07/02/2018) for Routine Prenatal Appointment.  Thomasene Mohair, MD, [facility], [facility] 06/25/2018 9:44 AM

## 2018-07-02 ENCOUNTER — Encounter: Payer: Self-pay | Admitting: Maternal Newborn

## 2018-07-02 ENCOUNTER — Ambulatory Visit (INDEPENDENT_AMBULATORY_CARE_PROVIDER_SITE_OTHER): Payer: Commercial Managed Care - PPO | Admitting: Maternal Newborn

## 2018-07-02 ENCOUNTER — Other Ambulatory Visit: Payer: Self-pay

## 2018-07-02 VITALS — BP 110/70 | Wt 147.0 lb

## 2018-07-02 DIAGNOSIS — Z3483 Encounter for supervision of other normal pregnancy, third trimester: Secondary | ICD-10-CM

## 2018-07-02 DIAGNOSIS — Z3480 Encounter for supervision of other normal pregnancy, unspecified trimester: Secondary | ICD-10-CM

## 2018-07-02 DIAGNOSIS — Z3A39 39 weeks gestation of pregnancy: Secondary | ICD-10-CM

## 2018-07-02 LAB — POCT URINALYSIS DIPSTICK OB
Glucose, UA: NEGATIVE
POC,PROTEIN,UA: NEGATIVE

## 2018-07-02 NOTE — Progress Notes (Signed)
    Routine Prenatal Care Visit  Subjective  Autumn Mullen is a 28 y.o. G2P1001 at [redacted]w[redacted]d being seen today for ongoing prenatal care.  She is currently monitored for the following issues for this low-risk pregnancy and has Supervision of other normal pregnancy, antepartum on their problem list.  ----------------------------------------------------------------------------------- Patient reports occasional contractions.   Contractions: Irregular. Vag. Bleeding: None.  Movement: Present. No leaking of fluid.  ----------------------------------------------------------------------------------- The following portions of the patient's history were reviewed and updated as appropriate: allergies, current medications, past family history, past medical history, past social history, past surgical history and problem list. Problem list updated.  Objective  Blood pressure 110/70, weight 147 lb (66.7 kg), last menstrual period 10/01/2017. Pregravid weight 112 lb (50.8 kg) Total Weight Gain 35 lb (15.9 kg) Urinalysis: Urine dipstick shows negative for glucose, protein.  Fetal Status: Fetal Heart Rate (bpm): 138 Fundal Height: 39 cm Movement: Present  Presentation: Vertex  General:  Alert, oriented and cooperative. Patient is in no acute distress.  Skin: Skin is warm and dry. No rash noted.   Cardiovascular: Normal heart rate noted  Respiratory: Normal respiratory effort, no problems with respiration noted  Abdomen: Soft, gravid, appropriate for gestational age. Pain/Pressure: Present     Pelvic:  Cervical exam performed Dilation: 2.5 Effacement (%): 60 Station: -2  Extremities: Normal range of motion.  Edema: None  Mental Status: Normal mood and affect. Normal behavior. Normal judgment and thought content.     Assessment   28 y.o. G2P1001 at [redacted]w[redacted]d, EDD 07/08/2018 by Last Menstrual Period presenting for a routine prenatal visit.  Plan   pregnancy Problems (from 11/26/17 to present)    Problem Noted  Resolved   Supervision of other normal pregnancy, antepartum 11/26/2017 by Tresea Mall, CNM No   Overview Addendum 06/11/2018 12:01 PM by Tresea Mall, CNM    Clinic [facility] Prenatal Labs  Dating L/9 Blood type: B/Positive/-- (12/02 1100)   Genetic Screen 1 Screen: negative    Antibody:Negative (12/02 1100)  Anatomic [place] complete- girl Rubella: 2.56 (12/02 1100) Varicella: Immune  GTT  Third trimester: 114 RPR: Non Reactive (12/02 1100)   Rhogam  not needed HBsAg: Negative (12/02 1100)   TDaP vaccine   Flu Shot: 02/24/2018 HIV: Non Reactive (12/02 1100)   Baby Food Breast                               GBS:   Contraception POP Pap:2017 NIL  CBB     CS/VBAC NA   Support Person Terrius           Desires to continue expectant management.  Term labor symptoms and general obstetric precautions including but not limited to vaginal bleeding, contractions, leaking of fluid and fetal movement were reviewed.  Please refer to After Visit Summary for other counseling recommendations.   Return in about 1 week (around 07/09/2018) for ROB.  Marcelyn Bruins, CNM 07/02/2018  9:39 AM

## 2018-07-02 NOTE — Patient Instructions (Signed)

## 2018-07-07 ENCOUNTER — Inpatient Hospital Stay
Admission: EM | Admit: 2018-07-07 | Discharge: 2018-07-09 | DRG: 806 | Disposition: A | Discharge status: Home / Self Care | Payer: Commercial Managed Care - PPO | Attending: Maternal Newborn | Admitting: Maternal Newborn

## 2018-07-07 ENCOUNTER — Other Ambulatory Visit: Payer: Self-pay

## 2018-07-07 ENCOUNTER — Encounter: Payer: Self-pay | Admitting: Obstetrics & Gynecology

## 2018-07-07 DIAGNOSIS — O1404 Mild to moderate pre-eclampsia, complicating childbirth: Secondary | ICD-10-CM | POA: Diagnosis present

## 2018-07-07 DIAGNOSIS — D696 Thrombocytopenia, unspecified: Secondary | ICD-10-CM | POA: Diagnosis present

## 2018-07-07 DIAGNOSIS — O26893 Other specified pregnancy related conditions, third trimester: Secondary | ICD-10-CM | POA: Diagnosis present

## 2018-07-07 DIAGNOSIS — D649 Anemia, unspecified: Secondary | ICD-10-CM | POA: Diagnosis present

## 2018-07-07 DIAGNOSIS — Z1159 Encounter for screening for other viral diseases: Secondary | ICD-10-CM

## 2018-07-07 DIAGNOSIS — Z3A39 39 weeks gestation of pregnancy: Secondary | ICD-10-CM

## 2018-07-07 DIAGNOSIS — O9912 Other diseases of the blood and blood-forming organs and certain disorders involving the immune mechanism complicating childbirth: Secondary | ICD-10-CM | POA: Diagnosis present

## 2018-07-07 DIAGNOSIS — Z3480 Encounter for supervision of other normal pregnancy, unspecified trimester: Secondary | ICD-10-CM

## 2018-07-07 DIAGNOSIS — O9902 Anemia complicating childbirth: Secondary | ICD-10-CM | POA: Diagnosis present

## 2018-07-07 DIAGNOSIS — D509 Iron deficiency anemia, unspecified: Secondary | ICD-10-CM | POA: Diagnosis present

## 2018-07-07 LAB — COMPREHENSIVE METABOLIC PANEL
ALT: 42 U/L (ref 0–44)
AST: 53 U/L — ABNORMAL HIGH (ref 15–41)
Albumin: 2.2 g/dL — ABNORMAL LOW (ref 3.5–5.0)
Alkaline Phosphatase: 82 U/L (ref 38–126)
Anion gap: 9 (ref 5–15)
BUN: 10 mg/dL (ref 6–20)
CO2: 19 mmol/L — ABNORMAL LOW (ref 22–32)
Calcium: 8.4 mg/dL — ABNORMAL LOW (ref 8.9–10.3)
Chloride: 110 mmol/L (ref 98–111)
Creatinine, Ser: 0.5 mg/dL (ref 0.44–1.00)
GFR calc Af Amer: >60 mL/min (ref >60)
GFR calc non Af Amer: >60 mL/min (ref >60)
Glucose, Bld: 89 mg/dL (ref 70–99)
Potassium: 3.8 mmol/L (ref 3.5–5.1)
Sodium: 138 mmol/L (ref 135–145)
Total Bilirubin: 0.4 mg/dL (ref 0.3–1.2)
Total Protein: 5 g/dL — ABNORMAL LOW (ref 6.5–8.1)

## 2018-07-07 LAB — CBC
HCT: 30.7 % — ABNORMAL LOW (ref 36.0–46.0)
Hemoglobin: 9.7 g/dL — ABNORMAL LOW (ref 12.0–15.0)
MCH: 27.1 pg (ref 26.0–34.0)
MCHC: 31.6 g/dL (ref 30.0–36.0)
MCV: 85.8 fL (ref 80.0–100.0)
Platelets: 112 10*3/uL — ABNORMAL LOW (ref 150–400)
RBC: 3.58 MIL/uL — ABNORMAL LOW (ref 3.87–5.11)
RDW: 13.8 % (ref 11.5–15.5)
WBC: 9.9 10*3/uL (ref 4.0–10.5)
nRBC: 0.3 % — ABNORMAL HIGH (ref 0.0–0.2)

## 2018-07-07 LAB — PROTEIN / CREATININE RATIO, URINE
Creatinine, Urine: 169 mg/dL
Protein Creatinine Ratio: 0.42 mg/mg{Cre} — ABNORMAL HIGH (ref 0.00–0.15)
Total Protein, Urine: 71 mg/dL

## 2018-07-07 LAB — SARS CORONAVIRUS 2 BY RT PCR (HOSPITAL ORDER, PERFORMED IN ~~LOC~~ HOSPITAL LAB): SARS Coronavirus 2: NEGATIVE

## 2018-07-07 MED ORDER — ONDANSETRON HCL 4 MG/2ML IJ SOLN: 4.0000 mg | INTRAMUSCULAR | Status: DC | PRN

## 2018-07-07 MED ORDER — DIBUCAINE (PERIANAL) 1 % EX OINT: 1.0000 "application " | TOPICAL_OINTMENT | CUTANEOUS | Status: DC | PRN

## 2018-07-07 MED ORDER — ACETAMINOPHEN 325 MG PO TABS: 650.0000 mg | ORAL_TABLET | ORAL | Status: DC | PRN

## 2018-07-07 MED ORDER — SENNOSIDES-DOCUSATE SODIUM 8.6-50 MG PO TABS
2.0000 | ORAL_TABLET | ORAL | Status: DC
  Administered 2018-07-08 – 2018-07-09 (×2): 2 via ORAL
  Filled 2018-07-07 (×3): qty 2

## 2018-07-07 MED ORDER — LACTATED RINGERS IV SOLN
INTRAVENOUS | Status: DC
  Administered 2018-07-07: 07:00:00 via INTRAVENOUS

## 2018-07-07 MED ORDER — AMMONIA AROMATIC IN INHA
RESPIRATORY_TRACT | Status: AC
  Filled 2018-07-07: qty 10

## 2018-07-07 MED ORDER — MISOPROSTOL 200 MCG PO TABS
ORAL_TABLET | ORAL | Status: AC
  Filled 2018-07-07: qty 4

## 2018-07-07 MED ORDER — FERROUS SULFATE 325 (65 FE) MG PO TABS
325.0000 mg | ORAL_TABLET | Freq: Two times a day (BID) | ORAL | Status: DC
  Administered 2018-07-07 – 2018-07-09 (×4): 325 mg via ORAL
  Filled 2018-07-07 (×4): qty 1

## 2018-07-07 MED ORDER — LIDOCAINE HCL (PF) 1 % IJ SOLN
30.0000 mL | INTRAMUSCULAR | Status: DC | PRN
  Filled 2018-07-07: qty 30

## 2018-07-07 MED ORDER — PRENATAL MULTIVITAMIN CH
1.0000 | ORAL_TABLET | Freq: Every day | ORAL | Status: DC
  Administered 2018-07-08 – 2018-07-09 (×2): 1 via ORAL
  Filled 2018-07-07 (×3): qty 1

## 2018-07-07 MED ORDER — DIPHENHYDRAMINE HCL 25 MG PO CAPS: 25.0000 mg | ORAL_CAPSULE | Freq: Four times a day (QID) | ORAL | Status: DC | PRN

## 2018-07-07 MED ORDER — LOPERAMIDE HCL 2 MG PO CAPS
2.0000 mg | ORAL_CAPSULE | ORAL | Status: DC | PRN
  Administered 2018-07-07: 2 mg via ORAL
  Filled 2018-07-07 (×2): qty 1

## 2018-07-07 MED ORDER — ACETAMINOPHEN 325 MG PO TABS
650.0000 mg | ORAL_TABLET | ORAL | Status: DC | PRN
  Administered 2018-07-07 – 2018-07-09 (×4): 650 mg via ORAL
  Filled 2018-07-07 (×4): qty 2

## 2018-07-07 MED ORDER — IBUPROFEN 600 MG PO TABS
600.0000 mg | ORAL_TABLET | Freq: Four times a day (QID) | ORAL | Status: DC
  Administered 2018-07-07 – 2018-07-08 (×4): 600 mg via ORAL
  Filled 2018-07-07 (×4): qty 1

## 2018-07-07 MED ORDER — CARBOPROST TROMETHAMINE 250 MCG/ML IM SOLN
250.0000 ug | Freq: Once | INTRAMUSCULAR | Status: AC
  Administered 2018-07-07: 250 ug via INTRAMUSCULAR
  Filled 2018-07-07: qty 1

## 2018-07-07 MED ORDER — OXYCODONE-ACETAMINOPHEN 5-325 MG PO TABS
2.0000 | ORAL_TABLET | ORAL | Status: DC | PRN
  Administered 2018-07-07: 2 via ORAL
  Filled 2018-07-07: qty 2

## 2018-07-07 MED ORDER — OXYTOCIN BOLUS FROM INFUSION
500.0000 mL | Freq: Once | INTRAVENOUS | Status: AC
  Administered 2018-07-07: 500 mL via INTRAVENOUS

## 2018-07-07 MED ORDER — OXYTOCIN 40 UNITS IN NORMAL SALINE INFUSION - SIMPLE MED
2.5000 [IU]/h | INTRAVENOUS | Status: DC
  Administered 2018-07-07: 2.5 [IU]/h via INTRAVENOUS

## 2018-07-07 MED ORDER — LACTATED RINGERS IV SOLN: 500.0000 mL | INTRAVENOUS | Status: DC | PRN

## 2018-07-07 MED ORDER — BUTORPHANOL TARTRATE 2 MG/ML IJ SOLN: 1.0000 mg | INTRAMUSCULAR | Status: DC | PRN

## 2018-07-07 MED ORDER — ONDANSETRON HCL 4 MG/2ML IJ SOLN: 4.0000 mg | Freq: Four times a day (QID) | INTRAMUSCULAR | Status: DC | PRN

## 2018-07-07 MED ORDER — OXYCODONE-ACETAMINOPHEN 5-325 MG PO TABS: 1.0000 | ORAL_TABLET | ORAL | Status: DC | PRN

## 2018-07-07 MED ORDER — COCONUT OIL OIL
1.0000 "application " | TOPICAL_OIL | Status: DC | PRN
  Administered 2018-07-07 – 2018-07-09 (×2): 1 via TOPICAL
  Filled 2018-07-07: qty 120

## 2018-07-07 MED ORDER — ONDANSETRON HCL 4 MG PO TABS: 4.0000 mg | ORAL_TABLET | ORAL | Status: DC | PRN

## 2018-07-07 MED ORDER — LIDOCAINE HCL (PF) 1 % IJ SOLN
INTRAMUSCULAR | Status: AC
  Filled 2018-07-07: qty 30

## 2018-07-07 MED ORDER — SIMETHICONE 80 MG PO CHEW: 80.0000 mg | CHEWABLE_TABLET | ORAL | Status: DC | PRN

## 2018-07-07 MED ORDER — OXYTOCIN 10 UNIT/ML IJ SOLN
INTRAMUSCULAR | Status: AC
  Filled 2018-07-07: qty 2

## 2018-07-07 MED ORDER — BENZOCAINE-MENTHOL 20-0.5 % EX AERO
1.0000 "application " | INHALATION_SPRAY | CUTANEOUS | Status: DC | PRN
  Administered 2018-07-07 – 2018-07-09 (×2): 1 via TOPICAL
  Filled 2018-07-07 (×2): qty 56

## 2018-07-07 MED ORDER — WITCH HAZEL-GLYCERIN EX PADS: 1.0000 "application " | MEDICATED_PAD | CUTANEOUS | Status: DC | PRN

## 2018-07-07 NOTE — Progress Notes (Signed)
Called to see patient for uterus that was deviated to right. RN was able to express a clot, but uterus did not return to midline despite being firm with massage, and there was a small trickle of blood present. Ordered one dose of Hemabate. Called to bedside to evaluate an additional clot that was expressed by RN with fundal massage. There was a medium sized piece of membrane present with the clot that I removed entirely by grasping it firmly with sponge forceps. Following this, the patient had a small gush of blood with fundal massage, after which her bleeding returned to normal. The uterus was firm and midline at the conclusion of my exam., with scant blood present on the pad after observation.   Avel Sensor, CNM 07/07/2018

## 2018-07-07 NOTE — H&P (Signed)
Obstetrics Admission History & Physical   No chief complaint on file.   HPI:  28 y.o. G2P1001 @ [redacted]w[redacted]d (07/08/2018, by Last Menstrual Period). Admitted on 07/07/2018:   Patient Active Problem List   Diagnosis Date Noted  . Normal labor and delivery 07/07/2018  . Supervision of other normal pregnancy, antepartum 11/26/2017     Presents for contractions worsening this am. No VB or ROM. Good FM.   Prenatal care at: at [facility]. Pregnancy complicated by none.  ROS: A review of systems was performed and negative, except as stated in the above HPI. PMHx:  Past Medical History:  Diagnosis Date  . Von Willebrand disease ([place])    PSHx:  Past Surgical History:  Procedure Laterality Date  . WISDOM TOOTH EXTRACTION  2011   Medications:  Medications Prior to Admission  Medication Sig Dispense Refill Last Dose  . acetaminophen (TYLENOL) 500 MG tablet Take 500 mg by mouth every 6 (six) hours as needed.   Past Week at Unknown time  . ferrous sulfate 325 (65 FE) MG tablet Take 325 mg by mouth daily with breakfast.   07/06/2018 at Unknown time  . Prenatal Vit-Fe Fumarate-FA (PRENATAL MULTIVITAMIN) TABS tablet Take 1 tablet by mouth daily at 12 noon.   07/06/2018 at Unknown time  . vitamin B-12 (CYANOCOBALAMIN) 1000 MCG tablet Take 1,000 mcg by mouth daily.   07/06/2018 at Unknown time  . docusate sodium (COLACE) 100 MG capsule Take 1 capsule (100 mg total) by mouth 2 (two) times daily as needed. (Patient not taking: Reported on 02/24/2018) 30 capsule 2 Unknown at Unknown time   Allergies: has No Known Allergies. OBHx:  OB History  Gravida Para Term Preterm AB Living  2 1 1     1   SAB TAB Ectopic Multiple Live Births          1    # Outcome Date GA Lbr Len/2nd Weight Sex Delivery Anes PTL Lv  2 Current           1 Term 01/05/14    F Vag-Spont  N LIV   IPJ:ASNKNLZJ/QBHALPFXTKWI except as detailed in HPI.Marland Kitchen  No family history of birth defects. Soc Hx: Alcohol: none and Recreational drug use:  none  Objective:   Vitals:   07/07/18 0700 07/07/18 0710  BP: (!) 153/116   Pulse: 64   Resp:    Temp:  98 F (36.7 C)   Constitutional: Well nourished, well developed female in no acute distress.  HEENT: normal Skin: Warm and dry.  Cardiovascular:Regular rate and rhythm.   Extremity: trace to 1+ bilateral pedal edema Respiratory: Clear to auscultation bilateral. Normal respiratory effort Abdomen: gravid, ND, FHT present, moderate tenderness on exam Back: no CVAT Neuro: DTRs 2+, Cranial nerves grossly intact Psych: Alert and Oriented x3. No memory deficits. Normal mood and affect.  MS: normal gait, normal bilateral lower extremity ROM/strength/stability.  Pelvic exam: is not limited by body habitus EGBUS: within normal limits Vagina: within normal limits and with normal mucosa Cervix: CERVIX: 8 cm dilated, 90 effaced, 0 station, presenting part VTX MEMBRANES: intact, AROM clear Uterus: Spontaneous uterine activity  Adnexa: not evaluated  EFM:FHR: 150 bpm, variability: moderate,  accelerations:  Present,  decelerations:  Absent Toco: Frequency: Every 3-5 minutes   Perinatal info:  Blood type: B positive Rubella- Immune Varicella -Immune TDaP Given during third trimester of this pregnancy RPR NR / HIV Neg/ HBsAg Neg   Assessment & Plan:   28 y.o. G2P1001 @ [redacted]w[redacted]d, Admitted  on 07/07/2018: Active labor    Admit for labor, Observe for cervical change and Fetal Wellbeing Reassuring  GBS Neg  Annamarie MajorPaul Yamen Castrogiovanni, MD, Merlinda FrederickFACOG [facility], [facility] 07/07/2018  7:18 AM

## 2018-07-07 NOTE — Lactation Note (Signed)
This note was copied from a baby's chart. Lactation Consultation Note  Patient Name: Autumn Mullen [id number] Date: 07/07/2018 Reason for consult: Follow-up assessment;Mother's request;Term  Mom wanted assistance once again from lactation to make sure [name] was latching correctly.  She was showing feeding cues when walked in room which were pointed out to mom.  Encouraged her to put [facility] to the breast anytime she was demonstrating any feeding cues.  Demonstrated how to sandwich breast and easily hand express some colostrum to entice [name] to latch. When she latched initially, mom felt some pinching and again towards end of feeding which mom recognized as an incorrect latch.  Stressed importance of wide mouth and flanged lips for adequate latch.  Explained transient nipple tenderness and coconut oil given with instructions in use.  Once she achieved deep enough latch, she began good rhythmic sucking with occasional swallows.  Mom's other concern is that she only breast fed first baby who is 43 years old now for 2 weeks and never quite had enough milk to satisfy her.  Discussed supply and demand and need for frequent feeding and/or pumping to bring in mature milk and ensure a plentiful milk supply.  Mom has already gotten a Spectra DEBP through her [employer].  Reviewed normal course of lactation, routine newborn feeding patterns, size of tummy first few days and cluster feeding with growth spurts.  Lactation name and number written on white board and encouraged to call with any questions, concerns or assistance.      Maternal Data Formula Feeding for Exclusion: No Has patient been taught Hand Expression?: Yes Does the patient have breastfeeding experience prior to this delivery?: Yes  Feeding Feeding Type: Breast Fed  LATCH Score Latch: Repeated attempts needed to sustain latch, nipple held in mouth throughout feeding, stimulation needed to elicit sucking reflex.  Audible Swallowing: A few with  stimulation  Type of Nipple: Everted at rest and after stimulation  Comfort (Breast/Nipple): Filling, red/small blisters or bruises, mild/mod discomfort  Hold (Positioning): Assistance needed to correctly position infant at breast and maintain latch.  LATCH Score: 6  Interventions Interventions: Breast feeding basics reviewed;Assisted with latch;Skin to skin;Breast massage;Hand express;Reverse pressure;Breast compression;Adjust position;Support pillows;Position options;Coconut oil  Lactation Tools Discussed/Used Tools: Coconut oil WIC Program: [employer])   Consult Status Consult Status: Follow-up Follow-up type: Call as needed    Jarold Motto 07/07/2018, 8:05 PM

## 2018-07-07 NOTE — Discharge Summary (Signed)
OB Discharge Summary     Patient Name: Autumn Mullen DOB: 11/08/1990 MRN: [id number]  Date of admission: 07/07/2018 Delivering Provider: Oswaldo ConroyJacelyn Y Schmid, CNM  Date of Delivery: 07/07/2018  Date of discharge: 07/09/2018 Admitting diagnosis: Contractions 39 weeks Intrauterine pregnancy: 7312w6d     Secondary diagnosis: iron deficiency anemia     Discharge diagnosis: Term Pregnancy Delivered, Post partum hemorrhage, Shoulder dystocia and possible mild preeclampsia                         Hospital course:  Onset of Labor With Vaginal Delivery     [age] y.o. yo G2P1001 at 5312w6d was admitted in Active Labor on 07/07/2018. Patient had an labor course as follows:  Membrane Rupture Time/Date: 8:07 AM ,07/07/2018   Intrapartum Procedures: Episiotomy: None [1]                                         Lacerations:  None [1]  Patient had a delivery of a Viable infant. There was a shoulder dystocia that resolved following McRoberts and suprapubic pressure (see delivery summary for details). She received a dose of Hemabate with abnormal bleeding/uterine deviation to the right. Several clots were expressed, and a piece of retained membrane was removed at the introitus, after which her bleeding was normal and her fundus was firm and midline.  07/07/2018  Information for the patient's newborn:  Benancio DeedsCoble, Girl Zhania [[id number]][[id number]]  Delivery Method: Vag-Spont    Following transfer to the postpartum unit, she had an uncomplicated postpartum course.  She is ambulating, tolerating a regular diet, passing flatus, and urinating well. Patient is discharged home in stable condition on 07/09/18.                                                                  Post partum procedures:blood transfusion-one unit PRBCs  Complications: Shoulder dystocia, postpartum hemorrhage  Physical exam on 07/09/2018: Vitals:   07/08/18 2307 07/09/18 0323 07/09/18 0825 07/09/18 1157  BP: 122/84 132/79 117/69 124/78  Pulse: 84 71 64 70  Resp: 18 18 18  18   Temp: 98.4 F (36.9 C) 98.2 F (36.8 C) 98.7 F (37.1 C) 98.1 F (36.7 C)  TempSrc: Oral Oral Oral Oral  SpO2: 100% 99% 98% 97%  Weight:      Height:       General: alert, cooperative and no distress Lochia: appropriate Uterine Fundus: firm/ U-2/ML/NT Incision: N/A DVT Evaluation: No evidence of DVT seen on physical exam.  Labs: Lab Results  Component Value Date   WBC 19.4 (H) 07/09/2018   HGB 9.8 (L) 07/09/2018   HCT 30.6 (L) 07/09/2018   MCV 85.7 07/09/2018   PLT 121 (L) 07/09/2018   CMP Latest Ref Rng & Units 07/07/2018  Glucose 70 - 99 mg/dL 89  BUN 6 - 20 mg/dL 10  Creatinine 1.300.44 - 8.651.00 mg/dL 7.840.50  Sodium 696135 - 295145 mmol/L 138  Potassium 3.5 - 5.1 mmol/L 3.8  Chloride 98 - 111 mmol/L 110  CO2 22 - 32 mmol/L 19(L)  Calcium 8.9 - 10.3 mg/dL 2.8(U8.4(L)  Total Protein 6.5 - 8.1 g/dL 5.0(L)  Total Bilirubin  0.3 - 1.2 mg/dL 0.4  Alkaline Phos 38 - 126 U/L 82  AST 15 - 41 U/L 53(H)  ALT 0 - 44 U/L 42    Discharge instruction: per After Visit Summary.  Medications:  Allergies as of 07/09/2018   No Known Allergies     Medication List    TAKE these medications   acetaminophen 500 MG tablet Commonly known as:  TYLENOL Take 500 mg by mouth every 6 (six) hours as needed.   docusate sodium 100 MG capsule Commonly known as:  COLACE Take 1 capsule (100 mg total) by mouth 2 (two) times daily as needed.   ferrous sulfate 325 (65 FE) MG tablet Take 1 tablet (325 mg total) by mouth 2 (two) times daily with a meal. What changed:  when to take this   norethindrone 0.35 MG tablet Commonly known as:  MICRONOR Take 1 tablet (0.35 mg total) by mouth daily. Start taking on:  August 03, 2018   prenatal multivitamin Tabs tablet Take 1 tablet by mouth daily at 12 noon.   vitamin B-12 1000 MCG tablet Commonly known as:  CYANOCOBALAMIN Take 1,000 mcg by mouth daily.       Diet: routine diet  Activity: Advance as tolerated. Pelvic rest for 6 weeks.   Outpatient follow  up: Follow-up Information    Rexene Agent, CNM. Schedule an appointment as soon as possible for a visit in 1 week(s).   Specialties:  Certified Nurse Midwife, Radiology Why:  Postpartum visit Contact information: [address] [place] [address] [phone]             Postpartum contraception: Progesterone only pills Rhogam Given postpartum: no Rubella vaccine given postpartum: no Varicella vaccine given postpartum: no TDaP given antepartum or postpartum: no, last TDAP about 4 years ago  Newborn Data: Live born female [place] Birth Weight: 8 lb APGAR: 8, 9  Newborn Delivery   Time head delivered:  07/07/2018 09:41:00 Birth date/time:  07/07/2018 09:43:00 Delivery type:  Vaginal, Spontaneous      Baby Feeding: Breast  Disposition:home with mother  SIGNED:  Dalia Heading, CNM 07/09/2018 4:35 PM

## 2018-07-08 ENCOUNTER — Encounter: Payer: Self-pay | Admitting: Certified Nurse Midwife

## 2018-07-08 LAB — CBC
HCT: 21.8 % — ABNORMAL LOW (ref 36.0–46.0)
HCT: 24.6 % — ABNORMAL LOW (ref 36.0–46.0)
Hemoglobin: 7.3 g/dL — ABNORMAL LOW (ref 12.0–15.0)
Hemoglobin: 7.9 g/dL — ABNORMAL LOW (ref 12.0–15.0)
MCH: 28.7 pg (ref 26.0–34.0)
MCH: 27.2 pg (ref 26.0–34.0)
MCHC: 33.5 g/dL (ref 30.0–36.0)
MCHC: 32.1 g/dL (ref 30.0–36.0)
MCV: 85.8 fL (ref 80.0–100.0)
MCV: 84.8 fL (ref 80.0–100.0)
Platelets: 100 10*3/uL — ABNORMAL LOW (ref 150–400)
Platelets: 115 10*3/uL — ABNORMAL LOW (ref 150–400)
RBC: 2.54 MIL/uL — ABNORMAL LOW (ref 3.87–5.11)
RBC: 2.9 MIL/uL — ABNORMAL LOW (ref 3.87–5.11)
RDW: 13.8 % (ref 11.5–15.5)
RDW: 14.9 % (ref 11.5–15.5)
WBC: 13.7 10*3/uL — ABNORMAL HIGH (ref 4.0–10.5)
WBC: 13.4 10*3/uL — ABNORMAL HIGH (ref 4.0–10.5)
nRBC: 0.4 % — ABNORMAL HIGH (ref 0.0–0.2)
nRBC: 0.7 % — ABNORMAL HIGH (ref 0.0–0.2)

## 2018-07-08 LAB — ABO/RH: ABO/RH(D): B POS

## 2018-07-08 LAB — PREPARE RBC (CROSSMATCH)

## 2018-07-08 LAB — RPR: RPR Ser Ql: NONREACTIVE

## 2018-07-08 MED ORDER — OXYCODONE HCL 5 MG PO TABS
5.0000 mg | ORAL_TABLET | Freq: Four times a day (QID) | ORAL | Status: DC | PRN
  Administered 2018-07-08: 12:00:00 5 mg via ORAL
  Filled 2018-07-08: qty 1

## 2018-07-08 MED ORDER — DIPHENHYDRAMINE HCL 25 MG PO CAPS
25.0000 mg | ORAL_CAPSULE | Freq: Once | ORAL | Status: AC
  Administered 2018-07-08: 12:00:00 25 mg via ORAL
  Filled 2018-07-08: qty 1

## 2018-07-08 MED ORDER — SODIUM CHLORIDE 0.9% IV SOLUTION
Freq: Once | INTRAVENOUS | Status: AC
  Administered 2018-07-08: 12:00:00 via INTRAVENOUS

## 2018-07-08 NOTE — Lactation Note (Addendum)
This note was copied from a baby's chart. Lactation Consultation Note  Patient Name: Autumn Mullen [id number] Date: 07/08/2018 Reason for consult: Initial assessment   Maternal Data Has patient been taught Hand Expression?: Yes Does the patient have breastfeeding experience prior to this delivery?: Yes  Feeding Feeding Type: Breast Milk  LATCH Score Latch: Too sleepy or reluctant, no latch achieved, no sucking elicited.  Audible Swallowing: None  Type of Nipple: Everted at rest and after stimulation  Comfort (Breast/Nipple): Soft / non-tender  Hold (Positioning): Full assist, staff holds infant at breast  LATCH Score: 4  Interventions Interventions: Hand express;DEBP  Lactation Tools Discussed/Used Pump Review: Setup, frequency, and cleaning Initiated by:: Elvera Lennox RN [place] Date initiated:: 07/08/18   Consult Status Consult Status: Follow-up Date: 07/08/18 Follow-up type: In-patient  LC to assist with breastfeeding. Infant is very fussy at the time and would not latch. A teaspoon of colostrum was hand expressed into a spoon and fed to infant. Mother initiated pumping using her own pump from home. Mother states that infant latched well the first two times she breast-fed and has not latched well since. LC will continue to evaluate infant's latch when she is calmer.  LC returned to assist again with breastfeeding. Infant is still fussy so was given 15 mL of donor breastmilk, then syringe fed a small amount while infant latched to the nipple shield to breastfeed. After breastfeeding for 30 minutes infant is still showing hunger cues so was fed 77mL.   Plan is for mother to breastfeed infant first using the nipple shield after reading hunger cues, then follow up with 20-30 mL of donor breast milk using slow flow nipple. Mother is to pump every 2-3 hours for 15 minutes.  Elvera Lennox 06/01/2704, 11:10 AM

## 2018-07-08 NOTE — Progress Notes (Signed)
Post Partum Day 1: SVD complicated by shoulder dystocia and PPH. Elevated blood pressures with PC ratio of 420 yesterday. Hx of abnormality of platelet aggregation. Subjective: voiding and tolerating PO No lightheadedness, but feeling weak. Difficulty getting baby to latch this AM. Was cluster feeding last night  Objective: Blood pressure 123/89, pulse 60, temperature 98 F (36.7 C), temperature source Oral, resp. rate 18, height 5\' 2"  (1.575 m), weight 66.2 kg, last menstrual period 10/01/2017, SpO2 99 %, unknown if currently breastfeeding. Temp:  [97.8 F (36.6 C)-99.2 F (37.3 C)] 98 F (36.7 C) (06/09 0739) Pulse Rate:  [57-74] 60 (06/09 0739) Resp:  [18-20] 18 (06/09 0739) BP: (112-154)/(61-122) 123/89 (06/09 0739) SpO2:  [98 %-100 %] 99 % (06/09 0739) Physical Exam:  General: alert, cooperative and no distress  Heart: RRR without murmur Lochia: appropriate Uterine Fundus: firm, U-2/ML/NT  DVT Evaluation: No evidence of DVT seen on physical exam.  Recent Labs    07/07/18 0611 07/08/18 0550  HGB 9.7* 7.3*  HCT 30.7* 21.8*  WBC 9.9 13.7*  PLT 112* 100*    Assessment/Plan: PPD #1 Symptomatic anemia s/p PPH with mild thrombocytopenia and history of abnormality of platelet aggregation. Discussed iron therapy and blood transfusion. Reviewed pros and cons of blood transfusion including risks of infection (HIV, hepatitis B, hepatitis C) and allergic reaction. She consented to receive blood. Will transfuse with one unit. Stop NSAIDS. Tylenol and roxicodone for pain as needed. CBC 4 hours post transfusion  Mild preeclampsia: mild blood pressure elevations yesterday postpartum, normotensive during the night. Monitor blood pressure closely  Breast feeding: difficulty latching: consult lactation  B POS/ RI/ VI  TDAP-did not receive AP. Last dose?  Contraception: POP     LOS: 1 day   Dalia Heading 07/08/2018, 9:14 AM

## 2018-07-09 ENCOUNTER — Inpatient Hospital Stay: Payer: Commercial Managed Care - PPO

## 2018-07-09 ENCOUNTER — Encounter: Payer: Commercial Managed Care - PPO | Admitting: Obstetrics and Gynecology

## 2018-07-09 DIAGNOSIS — D649 Anemia, unspecified: Secondary | ICD-10-CM | POA: Diagnosis present

## 2018-07-09 LAB — CBC
HCT: 30.6 % — ABNORMAL LOW (ref 36.0–46.0)
Hemoglobin: 9.8 g/dL — ABNORMAL LOW (ref 12.0–15.0)
MCH: 27.5 pg (ref 26.0–34.0)
MCHC: 32 g/dL (ref 30.0–36.0)
MCV: 85.7 fL (ref 80.0–100.0)
Platelets: 121 10*3/uL — ABNORMAL LOW (ref 150–400)
RBC: 3.57 MIL/uL — ABNORMAL LOW (ref 3.87–5.11)
RDW: 15.1 % (ref 11.5–15.5)
WBC: 19.4 10*3/uL — ABNORMAL HIGH (ref 4.0–10.5)
nRBC: 0.4 % — ABNORMAL HIGH (ref 0.0–0.2)

## 2018-07-09 LAB — HCG, QUANTITATIVE, PREGNANCY: hCG, Beta Chain, Quant, S: 2138 m[IU]/mL — ABNORMAL HIGH (ref <5)

## 2018-07-09 MED ORDER — FERROUS SULFATE 325 (65 FE) MG PO TABS: 325.0000 mg | ORAL_TABLET | Freq: Two times a day (BID) | ORAL | 3 refills | Status: DC

## 2018-07-09 MED ORDER — NORETHINDRONE 0.35 MG PO TABS: 1.0000 | ORAL_TABLET | Freq: Every day | ORAL | 11 refills | Status: DC

## 2018-07-09 MED ORDER — METHYLERGONOVINE MALEATE 0.2 MG PO TABS
0.2000 mg | ORAL_TABLET | Freq: Four times a day (QID) | ORAL | Status: DC
  Administered 2018-07-09 (×2): 0.2 mg via ORAL
  Filled 2018-07-09 (×2): qty 1

## 2018-07-09 NOTE — Progress Notes (Signed)
Post Partum Day 2: SVD complicated by shoulder dystocia and PPH. Elevated blood pressures with PC ratio of 420 on admission. Hx of abnormality of platelet aggregation. Received one unit PRBCs yesterday Subjective: Just passed a lemon sized clot with possibly some membrane mixed in with it.  Bleeding has been appropriate prior to that, but just was having a lot of pressure and cramping with breast feeding just before passing the clot.  Denies feeling lightheaded, but still feels weak in the legs. Has been ambulating around the room. Voiding without difficulty.  Objective: Blood pressure 117/69, pulse 64, temperature 98.7 F (37.1 C), temperature source Oral, resp. rate 18, height 5\' 2"  (1.575 m), weight 66.2 kg, last menstrual period 10/01/2017, SpO2 98 %, breastfeeding. Temp:  [97.8 F (36.6 C)-98.7 F (37.1 C)] 98.7 F (37.1 C) (06/10 0825) Pulse Rate:  [64-84] 64 (06/10 0825) Resp:  [18-20] 18 (06/10 0825) BP: (108-132)/(69-84) 117/69 (06/10 0825) SpO2:  [98 %-100 %] 98 % (06/10 0825) Physical Exam:  General: alert, cooperative and no distress  Lochia: small amount bleeding since passing lemon sized clot Uterine Fundus: firm, U-2/ML/ mild tenderness with uterine massage  DVT Evaluation: No evidence of DVT seen on physical exam.  Recent Labs    07/08/18 0550 07/08/18 1906  HGB 7.3* 7.9*  HCT 21.8* 24.6*  WBC 13.7* 13.4*  PLT 100* 115*    Assessment/Plan: PPD #1 Asymptomatic anemia s/p PPH with mild thrombocytopenia and history of abnormality of platelet aggregation. R/O retained products/placenta. Discussed case with Dr Kenton Kingfisher. Pelvic ultrasound ordered. Methergine 0.2 mgm po every 6 hours. Monitor blood pressures Repeat CBC later today  Mild preeclampsia?: mild blood pressure elevations 6/8, normotensive since then  Breast feeding: continue to support breast feeding, baby is latching on better and she is not needing to use the nipple shields  B POS/ RI/ VI  TDAP-did  not receive AP. Last dose?  Contraception: POP     LOS: 2 days   Dalia Heading 07/09/2018, 10:33 AM

## 2018-07-09 NOTE — Progress Notes (Signed)
Patient discharged home with infant. FOB present at discharge. Discharge instructions and prescriptions given and reviewed with patient. Patient verbalized understanding. Escorted out by staff. 

## 2018-07-09 NOTE — Progress Notes (Signed)
Patient transported to ultrasound at this time.    Hilbert Bible, RN

## 2018-07-09 NOTE — Progress Notes (Signed)
PostPartum Day 2 Subjective: Doing OK. Tylenol helping pain/cramping. No further clots. Bleeding appropriately. No lightheadedness when OOB. Ambulating without assistance  Objective: Blood pressure 132/79, pulse (!) 59, temperature 99.1 F (37.3 C), temperature source Oral, resp. rate 17, height 5\' 2"  (1.575 m), weight 66.2 kg, last menstrual period 10/01/2017, SpO2 99 %, unknown if currently breastfeeding.  FF at U-2/ML/NT  US Pelvis (transabdominal Only)  Result Date: 07/09/2018 CLINICAL DATA:  Postpartum hemorrhage EXAM: TRANSABDOMINAL ULTRASOUND OF PELVIS TECHNIQUE: Transabdominal ultrasound examination of the pelvis was performed including evaluation of the uterus, ovaries, adnexal regions, and pelvic cul-de-sac. COMPARISON:  None. FINDINGS: Uterus Measurements: 17.9 x 10.4 x 10.4 cm. = volume: 1011 mL. No fibroids or other mass visualized. Endometrium Thickness: 24.2 mm. Heterogeneity is noted within the endometrial stripe Right ovary Measurements: 2.5 x 1.6 x 2.3 cm = volume: 4.6 mL. Normal appearance/no adnexal mass. Left ovary Measurements: 3.2 x 2.0 x 2.3 cm = volume: 7.8 mL. Normal appearance/no adnexal mass. Other findings:  No abnormal free fluid. IMPRESSION: Heterogeneous endometrium although no significant increased blood flow is identified to suggest retained products. Normal-appearing postpartum uterus. Electronically Signed   By: Inez Catalina M.D.   On: 07/09/2018 15:21    Recent Labs    07/08/18 1906 07/09/18 1556  HGB 7.9* 9.8*  HCT 24.6* 30.6*  WBC 13.4* 19.4*  PLT 115* 121*    Assessment/Plan: Stable. No evidence of retained placenta, with normal bleeding, and increasing hemoglobin and platelet count Discharge home   LOS: 2 days   Dalia Heading 07/10/2018, 8:41 AM

## 2018-07-09 NOTE — Discharge Instructions (Signed)
Discharge Instructions:   Follow-up Appointment: 1 Week PP Visit and BP Check: June 16th at 2:50pm  If there are any new medications, they have been ordered and will be available for pickup at the listed pharmacy on your way home from the hospital.   Call office if you have any of the following: headache, visual changes, fever >101.0 F, chills, shortness of breath, breast concerns, excessive vaginal bleeding, incision drainage or problems, leg pain or redness, depression or any other concerns. If you have vaginal discharge with an odor, let your doctor know.   It is normal to bleed for up to 6 weeks. You should not soak through more than 1 pad in 1 hour. If you have a blood clot larger than your fist with continued bleeding, call your doctor.   Activity: Do not lift > 10 lbs for 6 weeks (do not lift anything heavier than your baby). No intercourse, tampons, swimming pools, hot tubs, baths (only showers) for 6 weeks.  No driving for 1-2 weeks. Continue prenatal vitamin, especially if breastfeeding. Increase calories and fluids (water) while breastfeeding.   Your milk will come in, in the next couple of days (right now it is colostrum). You may have a slight fever when your milk comes in, but it should go away on its own.  If it does not, and rises above 101 F please call the doctor. You will also feel achy and your breasts will be firm. They will also start to leak. If you are breastfeeding, continue as you have been and you can pump/express milk for comfort.   If you have too much milk, your breasts can become engorged, which could lead to mastitis. This is an infection of the milk ducts. It can be very painful and you will need to notify your doctor to obtain a prescription for antibiotics. You can also treat it with a shower or hot/cold compress.   For concerns about your baby, please call your pediatrician.  For breastfeeding concerns, the lactation consultant can be reached at  [phone].   Postpartum blues (feelings of happy one minute and sad another minute) are normal for the first few weeks but if it gets worse let your doctor know.   Congratulations! We enjoyed caring for you and your new bundle of joy!   Vaginal Delivery, Care After Refer to this sheet in the next few weeks. These discharge instructions provide you with information on caring for yourself after delivery. Your caregiver may also give you specific instructions. Your treatment has been planned according to the most current medical practices available, but problems sometimes occur. Call your caregiver if you have any problems or questions after you go home. HOME CARE INSTRUCTIONS 1. Take over-the-counter or prescription medicines only as directed by your caregiver or pharmacist. 2. Do not drink alcohol, especially if you are breastfeeding or taking medicine to relieve pain. 3. Do not smoke tobacco. 4. Continue to use good perineal care. Good perineal care includes: 1. Wiping your perineum from back to front 2. Keeping your perineum clean. 3. You can do sitz baths twice a day, to help keep this area clean 5. Do not use tampons, douche or have sex for 6 weeks 6. Shower only and avoid sitting in submerged water, aside from sitz baths 7. Wear a well-fitting bra that provides breast support. 8. Eat healthy foods. 9. Drink enough fluids to keep your urine clear or pale yellow. 10. Eat high-fiber foods such as whole grain cereals and breads,  brown rice, beans, and fresh fruits and vegetables every day. These foods may help prevent or relieve constipation. 11. Avoid constipation with high fiber foods or medications, such as miralax or metamucil 12. Follow your caregiver's recommendations regarding resumption of activities such as climbing stairs, driving, lifting, exercising, or traveling. 13. Talk to your caregiver about resuming sexual activities. Resumption of sexual activities after 6 weeks is  dependent upon your risk of infection, your rate of healing, and your comfort and desire to resume sexual activity. 14. Try to have someone help you with your household activities and your newborn for at least a few days after you leave the hospital. 15. Rest as much as possible. Try to rest or take a nap when your newborn is sleeping. 16. Increase your activities gradually. 17. Keep all of your scheduled postpartum appointments. It is very important to keep your scheduled follow-up appointments. At these appointments, your caregiver will be checking to make sure that you are healing physically and emotionally. SEEK MEDICAL CARE IF:   You are passing large clots from your vagina. Save any clots to show your caregiver.  You have a foul smelling discharge from your vagina.  You have trouble urinating.  You are urinating frequently.  You have pain when you urinate.  You have a change in your bowel movements.  You have increasing redness, pain, or swelling near your vaginal incision (episiotomy) or vaginal tear.  You have pus draining from your episiotomy or vaginal tear.  Your episiotomy or vaginal tear is separating.  You have painful, hard, or reddened breasts.  You have a severe headache.  You have blurred vision or see spots.  You feel sad or depressed.  You have thoughts of hurting yourself or your newborn.  You have questions about your care, the care of your newborn, or medicines.  You are dizzy or light-headed.  You have a rash.  You have nausea or vomiting.  You were breastfeeding and have not had a menstrual period within 12 weeks after you stopped breastfeeding.  You are not breastfeeding and have not had a menstrual period by the 12th week after delivery.  You have a fever of 100.5 or more SEEK IMMEDIATE MEDICAL CARE IF:   You have persistent pain.  You have chest pain.  You have shortness of breath.  You faint.  You have leg pain.  You have  stomach pain.  Your vaginal bleeding saturates two or more sanitary pads in 1 hour. MAKE SURE YOU:   Understand these instructions.  Will watch your condition.  Will get help right away if you are not doing well or get worse. Document Released: 01/13/2000 Document Revised: 06/01/2013 Document Reviewed: 09/12/2011 [facility] Patient Information 2015 [place]. This information is not intended to replace advice given to you by your health care provider. Make sure you discuss any questions you have with your health care provider.  Sitz Bath A sitz bath is a warm water bath taken in the sitting position. The water covers only the hips and butt (buttocks). We recommend using one that fits in the toilet, to help with ease of use and cleanliness. It may be used for either healing or cleaning purposes. Sitz baths are also used to relieve pain, itching, or muscle tightening (spasms). The water may contain medicine. Moist heat will help you heal and relax.  HOME CARE  Take 3 to 4 sitz baths a day. 18. Fill the bathtub half-full with warm water. 19. Sit in the  water and open the drain a little. 20. Turn on the warm water to keep the tub half-full. Keep the water running constantly. 21. Soak in the water for 15 to 20 minutes. 22. After the sitz bath, pat the affected area dry. GET HELP RIGHT AWAY IF: You get worse instead of better. Stop the sitz baths if you get worse. MAKE SURE YOU:  Understand these instructions.  Will watch your condition.  Will get help right away if you are not doing well or get worse. Document Released: 02/23/2004 Document Revised: 10/10/2011 Document Reviewed: 05/15/2010 [facility] Patient Information 2015 [place], [place]. This information is not intended to replace advice given to you by your health care provider. Make sure you discuss any questions you have with your health care provider.

## 2018-07-10 ENCOUNTER — Observation Stay
Admit: 2018-07-10 | Discharge: 2018-07-10 | Disposition: A | Discharge status: Home / Self Care | Payer: Commercial Managed Care - PPO | Attending: Obstetrics & Gynecology | Admitting: Obstetrics & Gynecology

## 2018-07-10 ENCOUNTER — Telehealth: Payer: Self-pay

## 2018-07-10 ENCOUNTER — Other Ambulatory Visit: Payer: Self-pay

## 2018-07-10 DIAGNOSIS — T192XXA Foreign body in vulva and vagina, initial encounter: Secondary | ICD-10-CM | POA: Diagnosis present

## 2018-07-10 LAB — TYPE AND SCREEN
ABO/RH(D): B POS
Antibody Screen: NEGATIVE
Unit division: 0

## 2018-07-10 LAB — BPAM RBC
Blood Product Expiration Date: 202007032359
ISSUE DATE / TIME: 202006091216
Unit Type and Rh: 7300

## 2018-07-10 MED ORDER — ACETAMINOPHEN 500 MG PO TABS
1000.0000 mg | ORAL_TABLET | Freq: Once | ORAL | Status: AC
  Administered 2018-07-10: 02:00:00 1000 mg via ORAL

## 2018-07-10 MED ORDER — METHYLERGONOVINE MALEATE 0.2 MG PO TABS
0.2000 mg | ORAL_TABLET | Freq: Once | ORAL | Status: AC
  Administered 2018-07-10: 02:00:00 0.2 mg via ORAL
  Filled 2018-07-10: qty 1

## 2018-07-10 NOTE — Telephone Encounter (Signed)
Pt called triage line last night, reporting something hanging out of her vagina and can not be pushed back in easily. Called pt to make appointment, she didn't answer. Left message to call back and schedule appointment

## 2018-07-10 NOTE — Final Progress Note (Signed)
Physician Final Progress Note  Patient ID: Autumn Mullen MRN: [id number] DOB/AGE: 25-Nov-1990 28 y.o.  Admit date: 07/10/2018 Admitting provider: Gae Dry, MD/ Jesus Genera. Danise Mina, CNM Discharge date: 07/10/2018   Admission Diagnoses: Vaginal foreign body postpartum  Discharge Diagnoses:  Retained products of conception  Consults: None  Significant Findings/ Diagnostic Studies: 28 year old G2 P2002 s/p SVD 0/08/1446 which was complicated by shoulder dystocia and postpartum hemorrhage/ anemia/thrombocytopenia and a history of a abnormality of platelet aggregation presented with complaints of something hanging out the vagina that looked like tissue. She was discharged from the hospital less than 12 hours earlier and her bleeding had been mild since discharge. Vital signs were normal and she was afebrile.  BP 128/82 (BP Location: Left Arm)   Pulse (!) 54   Temp 98.5 F (36.9 C) (Oral)   Resp 14  General: BF in NAD Heart: regular rate Lungs: normal respiratory effort Abdomen: FF at U-2/ML/NT Pelvic: External/BUS: membranes at vaginal opening Membranes were teased from the vagina with tissue forceps after which speculum was inserted to visualize the cervix A small bit of membranes was seen at cervix and removed along with a few small blood clots. Bleeding was minimal after this procedure which was tolerated very well by patient She was given a dose of Methergine 0.2 mgm with Tylenol 1000 mgm prior to discharge Advised to notify [facility] of any fever, heavy bleeding or passage of tissue Follow up appointment at [facility] in 1 week   Procedures: none  Discharge Condition: stable  Disposition: Discharge disposition: 01-Home or Self Care       Diet: Regular diet  Discharge Activity: No heavy lifting for 4-6 weeks   Allergies as of 07/10/2018   No Known Allergies     Medication List    ASK your doctor about these medications   acetaminophen 500 MG tablet Commonly known as:  TYLENOL Take 500 mg by mouth every 6 (six) hours as needed.   docusate sodium 100 MG capsule Commonly known as: COLACE Take 1 capsule (100 mg total) by mouth 2 (two) times daily as needed.   ferrous sulfate 325 (65 FE) MG tablet Take 1 tablet (325 mg total) by mouth 2 (two) times daily with a meal.   norethindrone 0.35 MG tablet Commonly known as: MICRONOR Take 1 tablet (0.35 mg total) by mouth daily. Start taking on: August 03, 2018   prenatal multivitamin Tabs tablet Take 1 tablet by mouth daily at 12 noon.   vitamin B-12 1000 MCG tablet Commonly known as: CYANOCOBALAMIN Take 1,000 mcg by mouth daily.        Total time spent taking care of this patient: 20 minutes  Signed: Dalia Heading 07/10/2018, 8:23 AM

## 2018-07-10 NOTE — Progress Notes (Signed)
Late Entry due to [facility] downtime.  Postpartum [facility] to [facility] per provider preference with chief complaint of a foreign body protruding from her vagina. Consents and initial vital signs were obtained.   Vital signs were WNL. Dalia Heading CNM assessed the patient and removed a portion of membranes from the vagina. Colleen massaged the fundus and stated "it was firm". Minimal bleeding was observed. Tylenol and Methergine were given. See eMAR. Final vital signs obtained. Vital signs were WNL. Patient discharged home. Discharge instructions given and reviewed. Patient verbalized understanding.. Escorted out by staff. See Jaclyn Shaggy progress note for further information.

## 2018-07-15 ENCOUNTER — Other Ambulatory Visit: Payer: Self-pay

## 2018-07-15 ENCOUNTER — Ambulatory Visit (INDEPENDENT_AMBULATORY_CARE_PROVIDER_SITE_OTHER): Payer: Commercial Managed Care - PPO | Admitting: Maternal Newborn

## 2018-07-15 ENCOUNTER — Other Ambulatory Visit (HOSPITAL_COMMUNITY)
Admission: RE | Admit: 2018-07-15 | Discharge: 2018-07-15 | Disposition: A | Discharge status: Home / Self Care | Payer: Commercial Managed Care - PPO | Source: Ambulatory Visit | Attending: Maternal Newborn | Admitting: Maternal Newborn

## 2018-07-15 DIAGNOSIS — N898 Other specified noninflammatory disorders of vagina: Secondary | ICD-10-CM | POA: Diagnosis present

## 2018-07-15 DIAGNOSIS — O9122 Nonpurulent mastitis associated with the puerperium: Secondary | ICD-10-CM

## 2018-07-15 MED ORDER — CEPHALEXIN 500 MG PO CAPS: 500.0000 mg | ORAL_CAPSULE | Freq: Four times a day (QID) | ORAL | 0 refills | Status: AC

## 2018-07-15 NOTE — Progress Notes (Signed)
Obstetrics & Gynecology Office Visit   Chief Complaint:  Chief Complaint  Patient presents with  . Blood Pressure Check    History of Present Illness: Autumn Peonrin presents today for a postpartum blood pressure check. Her blood pressure was mildly elevated at 140/98. She is not currently taking any medications for hypertension. She has very mild pedal edema (non-pitting). She does not have any headaches, epigastric pain, or visual changes. She notes an area of concern in her left breast that is firm and enlarged and does not resolve with pumping. She also has occasional chills and achy feelings. She passed one large clot last Thursday, otherwise her lochia has been normal. She is concerned that she may have BV because she has noticed a vaginal odor, even after showering.   Review of Systems: Review of systems negative unless otherwise noted in HPI.  Past Medical History:  Past Medical History:  Diagnosis Date  . 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 WISDOM TOOTH EXTRACTION  2011    Gynecologic History: No LMP recorded.  Obstetric History: [id number]  Family History:  Family History  Problem Relation Age of Onset  . Breast cancer Other   . Brain cancer Other     Social History:  Social History   Socioeconomic History  . Marital status: Single    Spouse name: Not on file  . Number of children: Not on file  . Years of education: Not on file  . Highest education level: Not on file  Occupational History  . Not on file  Social Needs  . Financial resource strain: Not on file  . Food insecurity    Worry: Not on file    Inability: Not on file  . Transportation needs    Medical: Not on file    Non-medical: Not on file  Tobacco Use  . Smoking status: Never Smoker  . Smokeless tobacco: Never Used  Substance and Sexual Activity  . Alcohol use: No    Frequency: Never  . Drug  use: No  . Sexual activity: Yes    Partners: Male    Birth control/protection: None  Lifestyle  . Physical activity    Days per week: 0 days    Minutes per session: 0 min  . Stress: Only a little  Relationships  . Social connections    Talks on phone: More than three times a week    Gets together: Once a week    Attends religious service: More than 4 times per year    Active member of club or organization: Yes    Attends meetings of clubs or organizations: More than 4 times per year    Relationship status: Never married  . Intimate partner violence    Fear of current or ex partner: No    Emotionally abused: No    Physically abused: No    Forced sexual activity: No  Other Topics Concern  . Not on file  Social History Narrative  . Not on file    Allergies:  No Known Allergies  Medications: Prior to Admission medications   Medication Sig Start Date End Date Taking? Authorizing Provider  acetaminophen (TYLENOL) 500 MG tablet Take 500 mg by mouth every 6 (six) hours as needed.   Yes [provider]  ferrous sulfate 325 (65 FE) MG tablet Take 1 tablet (325 mg total) by mouth 2 (two) times  daily with a meal. 07/09/18  Yes Dalia Heading, CNM  Prenatal Vit-Fe Fumarate-FA (PRENATAL MULTIVITAMIN) TABS tablet Take 1 tablet by mouth daily at 12 noon.   Yes [provider]  vitamin B-12 (CYANOCOBALAMIN) 1000 MCG tablet Take 1,000 mcg by mouth daily.   Yes [provider]  docusate sodium (COLACE) 100 MG capsule Take 1 capsule (100 mg total) by mouth 2 (two) times daily as needed. Patient not taking: Reported on 02/24/2018 02/10/18   Homero Fellers, MD  norethindrone (MICRONOR) 0.35 MG tablet Take 1 tablet (0.35 mg total) by mouth daily. 08/03/18   Dalia Heading, CNM    Physical Exam Vitals:  Vitals:   07/15/18 1448  BP: (!) 140/98   No LMP recorded.  General: NAD Pulmonary: No increased work of breathing Breasts: Left breast with palpable  swollen area at tail of Spence Genitourinary:  External: Normal external female genitalia.  Normal urethral  meatus, normal Bartholin's and Skene's glands.    Vagina: Normal vaginal mucosa, no evidence of prolapse,  scant blood present, no visible discharge    Cervix: deferred  Uterus: Non-enlarged, mobile, normal contour.   Adnexa: deferred  Rectal: deferred  Lymphatic: no evidence of inguinal lymphadenopathy Extremities: no edema, erythema, or tenderness Neurologic: Grossly intact Psychiatric: mood appropriate, affect full  Assessment: 28 y.o. G2P2002 with elevated blood pressure, possible mastitis, possible BV.  Plan: Problem List Items Addressed This Visit    None    Visit Diagnoses    Postpartum care following vaginal delivery    -  Primary   Relevant Medications   cephALEXin (KEFLEX) 500 MG capsule   Other Relevant Orders   Cervicovaginal ancillary only   Postpartum mastitis       Relevant Medications   cephALEXin (KEFLEX) 500 MG capsule   Vaginal odor       Relevant Orders   Cervicovaginal ancillary only     1) Empiric treatment for mastitis given unresolved left breastlump and chills/body aches. Should continue to empty breasts completely.  2) Aptima sent for BV/yeast due to patient's concern about persistent vaginal odor.  3) Return to clinic in one week for BP check. Pre-eclampsia precautions emphasized, and she will go to the hospital with onset of any concerning new symptoms.  Avel Sensor, CNM 07/15/2018  4:56 PM

## 2018-07-17 ENCOUNTER — Encounter: Payer: Self-pay | Admitting: Maternal Newborn

## 2018-07-17 LAB — CERVICOVAGINAL ANCILLARY ONLY
Bacterial vaginitis: NEGATIVE
Candida vaginitis: NEGATIVE

## 2018-07-23 ENCOUNTER — Other Ambulatory Visit: Payer: Self-pay

## 2018-07-23 ENCOUNTER — Encounter: Payer: Self-pay | Admitting: Advanced Practice Midwife

## 2018-07-23 ENCOUNTER — Ambulatory Visit (INDEPENDENT_AMBULATORY_CARE_PROVIDER_SITE_OTHER): Payer: Commercial Managed Care - PPO | Admitting: Advanced Practice Midwife

## 2018-07-23 VITALS — BP 120/78 | HR 90 | Ht 62.0 in | Wt 118.0 lb

## 2018-07-23 DIAGNOSIS — Z0130 Encounter for examination of blood pressure without abnormal findings: Secondary | ICD-10-CM

## 2018-07-23 NOTE — Progress Notes (Signed)
Obstetrics & Gynecology Office Visit   Chief Complaint:  Chief Complaint  Patient presents with  . Postpartum Follow-up    BP check   Date of delivery: 07/07/2018  History of Present Illness: 28 y.o. G2P2002 being seen for follow up blood pressure check today.  The patient is not pregnant. The established diagnosis for the patient is possible mild preeclampsia, postpartum hemorrhage, retained product of conception.  She is currently on no antihypertensives.  She reports temporal/orbital headaches since delivery not well relieved by tylenol.  Medication list reviewed no contraindicated medications. She has been trying to take naps as needed for adequate rest. She isn't sure she is making enough milk but admits baby is satisfied when nursing. She is reminded to increase healthy caloric intake and hydration and continue to stimulate breasts every 2-3 hours for best milk supply. She is drinking Mother's Milk tea. Suggested she make lactation cookies and continue to get adequate rest. She has been taking antibiotics for a clogged duct and denies any current breast pain.  Review of Systems:  Review of Systems  Constitutional: Negative.   HENT: Negative.   Eyes: Negative.   Respiratory: Negative.   Cardiovascular: Negative.   Gastrointestinal: Negative.   Genitourinary: Negative.   Musculoskeletal: Negative.   Skin: Negative.   Neurological: Positive for headaches.  Endo/Heme/Allergies: Negative.   Psychiatric/Behavioral: Negative.     Past Medical History:  Past Medical History:  Diagnosis Date  . 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 WISDOM TOOTH EXTRACTION  2011    Gynecologic History: Patient's last menstrual period was 07/07/2018.  Obstetric History: [id number]  Family History:  Family History  Problem Relation Age of Onset  . Breast cancer Other   . Brain cancer  Other     Social History:  Social History   Socioeconomic History  . Marital status: Single    Spouse name: Not on file  . Number of children: Not on file  . Years of education: Not on file  . Highest education level: Not on file  Occupational History  . Not on file  Social Needs  . Financial resource strain: Not on file  . Food insecurity    Worry: Not on file    Inability: Not on file  . Transportation needs    Medical: Not on file    Non-medical: Not on file  Tobacco Use  . Smoking status: Never Smoker  . Smokeless tobacco: Never Used  Substance and Sexual Activity  . Alcohol use: No    Frequency: Never  . Drug use: No  . Sexual activity: Yes    Partners: Male    Birth control/protection: None  Lifestyle  . Physical activity    Days per week: 0 days    Minutes per session: 0 min  . Stress: Only a little  Relationships  . Social connections    Talks on phone: More than three times a week    Gets together: Once a week    Attends religious service: More than 4 times per year    Active member of club or organization: Yes    Attends meetings of clubs or organizations: More than 4 times per year    Relationship status: Never married  . Intimate partner violence    Fear of current or ex partner: No    Emotionally abused: No    Physically  abused: No    Forced sexual activity: No  Other Topics Concern  . Not on file  Social History Narrative  . Not on file    Allergies:  No Known Allergies  Medications: Prior to Admission medications   Medication Sig Start Date End Date Taking? Authorizing Provider  acetaminophen (TYLENOL) 500 MG tablet Take 500 mg by mouth every 6 (six) hours as needed.   Yes [provider]  cephALEXin (KEFLEX) 500 MG capsule Take 1 capsule (500 mg total) by mouth 4 (four) times daily for 10 days. 07/15/18 07/25/18 Yes Rexene Agent, CNM  ferrous sulfate 325 (65 FE) MG tablet Take 1 tablet (325 mg total) by mouth 2 (two) times  daily with a meal. 07/09/18  Yes Dalia Heading, CNM  Prenatal Vit-Fe Fumarate-FA (PRENATAL MULTIVITAMIN) TABS tablet Take 1 tablet by mouth daily at 12 noon.   Yes [provider]  vitamin B-12 (CYANOCOBALAMIN) 1000 MCG tablet Take 1,000 mcg by mouth daily.   Yes [provider]  docusate sodium (COLACE) 100 MG capsule Take 1 capsule (100 mg total) by mouth 2 (two) times daily as needed. Patient not taking: Reported on 02/24/2018 02/10/18   Homero Fellers, MD  norethindrone (MICRONOR) 0.35 MG tablet Take 1 tablet (0.35 mg total) by mouth daily. Patient not taking: Reported on 07/23/2018 08/03/18   Dalia Heading, CNM    Physical Exam Blood pressure 120/78, pulse 90, height 5\' 2"  (1.575 m), weight 118 lb (53.5 kg), last menstrual period 07/07/2018, currently breastfeeding.   General: NAD HEENT: normocephalic, anicteric Pulmonary: No increased work of breathing Cardiovascular: RRR, distal pulses 2+ Extremities: no edema, no erythema, no tenderness Neurologic: Grossly intact Psychiatric: mood appropriate, affect full  Assessment: 28 y.o. [id number] presenting for blood pressure evaluation today  Plan: Problem List Items Addressed This Visit    None      1) Blood pressure - blood pressure at today's visit is normotensive.  As a result antihypertensive therapy is currently not warranted. - additional blood work was not obtained 2) OTC magnesium 250 mg tablet, caffeine plus tylenol for headaches, increase hydration, adequate sleep 3) Continue nursing or pumping every 2-3 hours   Rod Can, CNM [facility] 07/23/2018, 10:49 AM

## 2018-08-20 ENCOUNTER — Encounter: Payer: Self-pay | Admitting: Maternal Newborn

## 2018-08-20 ENCOUNTER — Other Ambulatory Visit (HOSPITAL_COMMUNITY)
Admission: RE | Admit: 2018-08-20 | Discharge: 2018-08-20 | Disposition: A | Discharge status: Home / Self Care | Payer: Commercial Managed Care - PPO | Source: Ambulatory Visit | Attending: Maternal Newborn | Admitting: Maternal Newborn

## 2018-08-20 ENCOUNTER — Ambulatory Visit (INDEPENDENT_AMBULATORY_CARE_PROVIDER_SITE_OTHER): Payer: Commercial Managed Care - PPO | Admitting: Maternal Newborn

## 2018-08-20 ENCOUNTER — Other Ambulatory Visit: Payer: Self-pay

## 2018-08-20 DIAGNOSIS — Z124 Encounter for screening for malignant neoplasm of cervix: Secondary | ICD-10-CM | POA: Diagnosis present

## 2018-08-20 NOTE — Progress Notes (Signed)
Postpartum Visit  Chief Complaint:  Chief Complaint  Patient presents with  . Postpartum Care    History of Present Illness: Patient is a 28 y.o. [id number] presenting for a postpartum visit.  Date of delivery: 07/07/2018 Type of delivery: Vaginal delivery - Vacuum or forceps assisted  no Episiotomy: No.  Laceration: no  Pregnancy or labor problems:  Yes, pre-eclampsia, shoulder dystocia, retained placental fragments/PPH. Any problems since the delivery:  Yes, had a visit to triage for retained fragments, postpartum hypertension, and mastitis. All have since resolved.  Newborn Details:  SINGLETON   Gender: Female.  Birth weight: 8 lb  Maternal Details:  Breast Feeding:  No, recently switched to formula Post partum depression/anxiety noted:  Yes, mild anxiety Edinburgh Post-Partum Depression Score:  6 Date of last PAP: 2017 normal   Review of Systems  Constitutional: Negative.   HENT: Negative.   Eyes: Negative.   Respiratory: Negative for shortness of breath and wheezing.   Cardiovascular: Negative for chest pain and palpitations.  Gastrointestinal: Negative for abdominal pain.  Genitourinary: Negative.   Musculoskeletal: Negative.   Skin: Negative.   Neurological: Positive for headaches.  Endo/Heme/Allergies: Bruises/bleeds easily.  Psychiatric/Behavioral: Negative for depression. The patient is nervous/anxious.   All other systems reviewed and are negative.   Past Medical History:  Past Medical History:  Diagnosis Date  . 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 WISDOM TOOTH EXTRACTION  2011    Family History:  Family History  Problem Relation Age of Onset  . Breast cancer Other   . Brain cancer Other     Social History:  Social History   Socioeconomic History  . Marital status: Single    Spouse name: Not on file  . Number of children: Not on file  .  Years of education: Not on file  . Highest education level: Not on file  Occupational History  . Not on file  Social Needs  . Financial resource strain: Not on file  . Food insecurity    Worry: Not on file    Inability: Not on file  . Transportation needs    Medical: Not on file    Non-medical: Not on file  Tobacco Use  . Smoking status: Never Smoker  . Smokeless tobacco: Never Used  Substance and Sexual Activity  . Alcohol use: No    Frequency: Never  . Drug use: No  . Sexual activity: Not Currently    Partners: Male    Birth control/protection: Pill  Lifestyle  . Physical activity    Days per week: 0 days    Minutes per session: 0 min  . Stress: Only a little  Relationships  . Social connections    Talks on phone: More than three times a week    Gets together: Once a week    Attends religious service: More than 4 times per year    Active member of club or organization: Yes    Attends meetings of clubs or organizations: More than 4 times per year    Relationship status: Never married  . Intimate partner violence    Fear of current or ex partner: No    Emotionally abused: No    Physically abused: No    Forced sexual activity: No  Other Topics Concern  . Not on file  Social History Narrative  . Not on file    Allergies:  No Known Allergies  Medications: Prior to Admission medications   Medication Sig Start Date End Date Taking? Authorizing Provider  acetaminophen (TYLENOL) 500 MG tablet Take 500 mg by mouth every 6 (six) hours as needed.   Yes [provider]  ferrous sulfate 325 (65 FE) MG tablet Take 1 tablet (325 mg total) by mouth 2 (two) times daily with a meal. 07/09/18  Yes Farrel ConnersGutierrez, Colleen, CNM  norethindrone (MICRONOR) 0.35 MG tablet Take 1 tablet (0.35 mg total) by mouth daily. 08/03/18  Yes Farrel ConnersGutierrez, Colleen, CNM  Prenatal Vit-Fe Fumarate-FA (PRENATAL MULTIVITAMIN) TABS tablet Take 1 tablet by mouth daily at 12 noon.   Yes [provider]  vitamin B-12 (CYANOCOBALAMIN) 1000 MCG tablet Take 1,000 mcg by mouth daily.   Yes [provider]    Physical Exam Vitals:  Vitals:   08/20/18 1035  BP: 94/70    General: NAD HEENT: normocephalic, anicteric Cardiovascular: RRR, no murmurs, rubs, or gallops Pulmonary: No increased work of breathing, CTAB Abdomen: Soft, non-tender, non-distended.   Genitourinary:  External: Normal external female genitalia.  Normal urethral  meatus, normal Bartholin's and Skene's glands.    Vagina: Normal vaginal mucosa, no evidence of prolapse.    Cervix: Grossly normal in appearance, no bleeding  Uterus: Non-enlarged, mobile, normal contour.  No CMT  Adnexa: ovaries non-enlarged, no adnexal masses  Rectal: deferred Extremities: no edema, erythema, or tenderness Neurologic: Grossly intact Psychiatric: mood appropriate, affect full  Assessment: 28 y.o. [id number] presenting for a 6 week postpartum visit  Plan: Problem List Items Addressed This Visit    None    Visit Diagnoses    Postpartum care following vaginal delivery    -  Primary   Pap smear for cervical cancer screening       Relevant Orders   Cytology - PAP      1) Contraception: Started progesterone only pills and is currently satisfied with this method. Discussed that she could switch to combined OCP now that she is no longer breastfeeding, but she declines for now/  2)  Pap done today.  3) Patient underwent screening for postpartum depression with no concerns noted. She feels that she had a lot of stress around breastfeeding, which has lessened some now that she is formula feeding. Her mood is improving. Aware to contact us if symptoms of anxiety worsen or she develops symptoms of postpartum depression.  4) Follow up 1 year for an annual exam.  Marcelyn BruinsJacelyn Felecia Stanfill, CNM 08/20/2018  11:14 AM

## 2018-08-22 LAB — CYTOLOGY - PAP: Diagnosis: NEGATIVE

## 2018-08-25 ENCOUNTER — Telehealth: Payer: Self-pay

## 2018-08-25 NOTE — Telephone Encounter (Signed)
FMLA/DISABILITY form for [employer] filled out, signature obtained and given to KT for processing. 

## 2018-08-29 ENCOUNTER — Telehealth: Payer: Self-pay

## 2018-08-29 NOTE — Telephone Encounter (Signed)
Pt is needing a through letter stating the reasons she needs to stay out of work until 09/09/18. That she was having a lot of anxiety and post partum depression. Please advise on this matter. Pt is aware JS will be back in the office on 09/01/18.

## 2018-08-29 NOTE — Telephone Encounter (Signed)
Pt requesting to speak to Autumn Mullen about her FMLA. She has questions about it. [place]#[phone]

## 2018-09-01 NOTE — Telephone Encounter (Signed)
Spoke with JS about issue she is currently working on the matter and will get back with me on when to tell pt the letter is complete

## 2018-09-01 NOTE — Telephone Encounter (Signed)
Should be available now in [place].

## 2018-09-02 NOTE — Telephone Encounter (Signed)
Pt calling; was sent a form for FMLA thru [employer].  Is trying to figure out if it was sent to her company.  [phone]

## 2018-09-02 NOTE — Telephone Encounter (Signed)
Called pt and she stated that she has already sent them an e-mail with the letter attached to see if that will be enough for them to cover her being out of work 1 more week. I told pt to let us know if that does not work and offered to send her post partum office notes if they require it. Pt understood and said she would get back with [place] if something else needs to happen.

## 2018-09-03 ENCOUNTER — Telehealth: Payer: Self-pay

## 2018-09-03 NOTE — Telephone Encounter (Signed)
Pt calling triage today needing office visit from 7/22 and documentaion as to why she could not return to work until 8/11.

## 2018-09-03 NOTE — Telephone Encounter (Signed)
Pts notes faxed to appropriate number and pt has been notified.

## 2018-09-10 ENCOUNTER — Telehealth: Payer: Self-pay

## 2018-09-10 NOTE — Telephone Encounter (Signed)
Left detailed msg for pt to call c detailed on the nurse line.

## 2018-09-10 NOTE — Telephone Encounter (Signed)
Pt called after hour nurse at 7:01 this am needing someone to contact her regarding her paperwork.  [phone]

## 2018-09-11 NOTE — Telephone Encounter (Signed)
This should be available on MyChart now to print for the paperwork. Thanks.

## 2018-09-11 NOTE — Telephone Encounter (Signed)
Pt aware letter and amended FMLA has been faxed and confirmation recv'd.

## 2018-09-11 NOTE — Telephone Encounter (Signed)
Pt returning call about FMLA and her 6wk pp visit.  [phone] Pt states she needs a note faxed to her case manager at work stating she had a blood tx, was readmitted to hosp for pp hemorrhage, has pp anxiety/depression, BP elevation, return to work 09/09/18.

## 2019-02-09 ENCOUNTER — Ambulatory Visit: Payer: Commercial Managed Care - PPO | Admitting: Certified Nurse Midwife

## 2019-02-17 ENCOUNTER — Other Ambulatory Visit: Payer: Self-pay

## 2019-02-17 ENCOUNTER — Encounter: Payer: Self-pay | Admitting: Certified Nurse Midwife

## 2019-02-17 ENCOUNTER — Ambulatory Visit (INDEPENDENT_AMBULATORY_CARE_PROVIDER_SITE_OTHER): Payer: Commercial Managed Care - PPO | Admitting: Certified Nurse Midwife

## 2019-02-17 VITALS — BP 100/70 | HR 60 | Ht 62.0 in | Wt 105.0 lb

## 2019-02-17 DIAGNOSIS — F53 Postpartum depression: Secondary | ICD-10-CM | POA: Diagnosis not present

## 2019-02-17 DIAGNOSIS — O99345 Other mental disorders complicating the puerperium: Secondary | ICD-10-CM

## 2019-02-17 MED ORDER — SERTRALINE HCL 50 MG PO TABS: ORAL_TABLET | ORAL | 2 refills | Status: DC

## 2019-03-02 NOTE — Progress Notes (Signed)
Obstetrics & Gynecology Office Visit   Chief Complaint:  Chief Complaint  Patient presents with  . Follow-up    POSTPARTUM DEPRESSION - feels the same    History of Present Illness: 29 year old G2 P2002, now 7 months postpartum, who presents with concerns regarding postpartum depression. She is feeling overwhelmed, trying to work and take care of herself and her children. She feels irritable and short with her older child. She scores a 16 on her EPDS and the score on question #10 is 1. She has no plans for suicide, but has had transient thoughts about how things would be if she were not living.  No history of PPD with first pregnancy. No history of bipolar disorder or domestic violence.  The father of her second baby is trying to be supportive. The father of her first baby is not involved.  Review of Systems:  ROS   Past Medical History:  Past Medical History:  Diagnosis Date  . 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 Postpartum depression 2020    Past Surgical History:  Past Surgical History:  Procedure Laterality Date  . WISDOM TOOTH EXTRACTION  2011    Gynecologic History: Patient's last menstrual period was 02/10/2019 (exact date).  Obstetric History: [id number]  Family History:  Family History  Problem Relation Age of Onset  . Breast cancer Other   . Brain cancer Other     Social History:  Social History   Socioeconomic History  . Marital status: Single    Spouse name: Not on file  . Number of children: Not on file  . Years of education: Not on file  . Highest education level: Not on file  Occupational History  . Not on file  Tobacco Use  . Smoking status: Never Smoker  . Smokeless tobacco: Never Used  Substance and Sexual Activity  . Alcohol use: Yes    Alcohol/week: 1.0 standard drinks    Types: 1 Glasses of wine per week    Comment: before bedtime occ  . Drug use: No  . Sexual activity: Yes    Partners: Male    Birth  control/protection: Pill  Other Topics Concern  . Not on file  Social History Narrative  . Not on file   Social Determinants of Health   Financial Resource Strain:   . Difficulty of Paying Living Expenses: Not on file  Food Insecurity:   . Worried About Programme researcher, broadcasting/film/video in the Last Year: Not on file  . Ran Out of Food in the Last Year: Not on file  Transportation Needs:   . Lack of Transportation (Medical): Not on file  . Lack of Transportation (Non-Medical): Not on file  Physical Activity:   . Days of Exercise per Week: Not on file  . Minutes of Exercise per Session: Not on file  Stress:   . Feeling of Stress : Not on file  Social Connections:   . Frequency of Communication with Friends and Family: Not on file  . Frequency of Social Gatherings with Friends and Family: Not on file  . Attends Religious Services: Not on file  . Active Member of Clubs or Organizations: Not on file  . Attends Banker Meetings: Not on file  . Marital Status: Not on file  Intimate Partner Violence:   . Fear of Current or Ex-Partner: Not on file  . Emotionally Abused: Not on file  . Physically Abused: Not on file  .  Sexually Abused: Not on file    Allergies:  No Known Allergies  Medications: Prior to Admission medications   Medication Sig Start Date End Date Taking? Authorizing Provider  acetaminophen (TYLENOL) 500 MG tablet Take 500 mg by mouth every 6 (six) hours as needed.   Yes [provider]  norethindrone (MICRONOR) 0.35 MG tablet Take 1 tablet (0.35 mg total) by mouth daily. 08/03/18  Yes Dalia Heading, CNM  ferrous sulfate 325 (65 FE) MG tablet Take 1 tablet (325 mg total) by mouth 2 (two) times daily with a meal. Patient not taking: Reported on 02/17/2019 07/09/18   Dalia Heading, CNM  Prenatal Vit-Fe Fumarate-FA (PRENATAL MULTIVITAMIN) TABS tablet Take 1 tablet by mouth daily at 12 noon.    [provider]         vitamin B-12 (CYANOCOBALAMIN)  1000 MCG tablet Take 1,000 mcg by mouth daily.    [provider]    Physical Exam Vitals:BP 100/70   Pulse 60   Ht 5\' 2"  (1.575 m)   Wt 105 lb (47.6 kg)   LMP 02/10/2019 (Exact Date)   BMI 19.20 kg/m      Patient's last menstrual period was 02/10/2019 (exact date).  Physical Exam  Constitutional: She is oriented to person, place, and time. She appears well-developed and well-nourished.  Neurological: She is alert and oriented to person, place, and time.  Skin: Skin is warm and dry.  Psychiatric:  Depressed, tearful at time   EPDS=16  Assessment: 29 y.o. [id number] with postpartum depression/anxiety  Plan: Discussed treatment of PPD with 1) medication, 2). Therapy/counseling, or 3). Both medication and therapy. Would like to try antidepressant and therapy.  RX for Zoloft 25 mgm daily x 1 week then increase to 50 mgm daily. Discussed MOA and possible side effects Given names of therapists in the area and [employer]. RTO for follow up in 2-3 weeks or sooner prn worsening symptoms  Dalia Heading, CNM

## 2019-03-03 ENCOUNTER — Ambulatory Visit (INDEPENDENT_AMBULATORY_CARE_PROVIDER_SITE_OTHER): Payer: Commercial Managed Care - PPO | Admitting: Certified Nurse Midwife

## 2019-03-03 ENCOUNTER — Other Ambulatory Visit: Payer: Self-pay

## 2019-03-03 ENCOUNTER — Encounter: Payer: Self-pay | Admitting: Certified Nurse Midwife

## 2019-03-03 DIAGNOSIS — F53 Postpartum depression: Secondary | ICD-10-CM | POA: Diagnosis not present

## 2019-03-03 NOTE — Progress Notes (Signed)
  History of Present Illness:  Autumn Mullen is a 28 y.o. G2 P2, now 7 months postpartum, who was started on Zoloft 02/17/2019 for postpartum depression/ anxiety. She reports only taking the medication fro a few days then stopping because the medication made her feel jittery and she felt more anxiety over taking the medication. She did contact a therapist over the [employer], community and has had 2 video conferences with this therapist [name]). The therapist is having her keep a journal regarding her feelings so she can write them down and get those negative feelings out. Her partner has been researching ways to help her thru the PPD. She is feeling better.   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 Also,  has a past surgical history that includes Wisdom tooth extraction (2011)., family history includes Brain cancer in an other family member; Breast cancer in an other family member.,  reports that she has never smoked. She has never used smokeless tobacco. She reports current alcohol use of about 1.0 standard drinks of alcohol per week. She reports that she does not use drugs.  She has a current medication list which includes the following prescription(s): acetaminophen, norethindrone, ferrous sulfate, prenatal multivitamin, sertraline, and vitamin b-12. Also, has No Known Allergies.  ROS  Physical Exam:  BP 90/60   Pulse 78   Ht 5\' 2"  (1.575 m)   Wt 102 lb (46.3 kg)   LMP 02/10/2019 (Exact Date)   BMI 18.66 kg/m  Body mass index is 18.66 kg/m. Constitutional: Well nourished, well developed female in no acute distress.   Neuro: Grossly intact Psych:  Normal mood and affect.   The EPDS score has decreased from 16 to 9  Assessment: Improvement in mood with counseling/ therapy Declines use of antidepressants at this time  Plan: Continued counseling with her therapist RTO in 2 months for follow  up and prn  04/10/2019, CNM [facility] 03/03/2019  8:23 AM

## 2019-03-06 DIAGNOSIS — F53 Postpartum depression: Secondary | ICD-10-CM | POA: Insufficient documentation

## 2019-05-07 ENCOUNTER — Ambulatory Visit: Payer: Commercial Managed Care - PPO | Admitting: Certified Nurse Midwife

## 2019-07-30 ENCOUNTER — Other Ambulatory Visit: Payer: Self-pay | Admitting: Certified Nurse Midwife

## 2019-08-04 ENCOUNTER — Telehealth: Payer: Self-pay

## 2019-08-04 MED ORDER — NORETHINDRONE 0.35 MG PO TABS: 1.0000 | ORAL_TABLET | Freq: Every day | ORAL | 0 refills | Status: DC

## 2019-08-04 NOTE — Telephone Encounter (Signed)
Pt aware refill eRx'd. 

## 2019-08-04 NOTE — Telephone Encounter (Signed)
Pt calling; bc refill rejected; wants to know why.  [phone]  Pt needs annual.

## 2019-08-04 NOTE — Telephone Encounter (Signed)
Pt has been scheduled.  °

## 2019-08-20 ENCOUNTER — Other Ambulatory Visit (HOSPITAL_COMMUNITY)
Admission: RE | Admit: 2019-08-20 | Discharge: 2019-08-20 | Disposition: A | Discharge status: Home / Self Care | Payer: Commercial Managed Care - PPO | Source: Ambulatory Visit | Attending: Certified Nurse Midwife | Admitting: Certified Nurse Midwife

## 2019-08-20 DIAGNOSIS — Z124 Encounter for screening for malignant neoplasm of cervix: Secondary | ICD-10-CM | POA: Insufficient documentation

## 2019-08-20 NOTE — Progress Notes (Addendum)
Gynecology Annual Exam  PCP: [facility], [place]  Chief Complaint  Patient presents with   Gynecologic Exam    severe pain c period, midol doesn't help; not cramps; upper abd tenderness sometimes it bulges; more freq H/As;    History of Present Illness:  [name] is a 29 y.o. G1P1001 who LMP was 07/21/2019 (approximate)., presents today for her annual examination.  Her menses are regular every 28-30 days, lasting 4-7 day(s) with most days requiring pad and tampon change every 2 hours. She is currently using POPs for contraception but is no longer breast feeding   Dysmenorrhea mild, occurring first 1-2 days of flow. . She began having a different pain with her menses around [facility] November.  It is a throbbing pain in her mons pubis that begins with the menses and stops after menses stops. Midol does not help the pain. Since her last vaginal delivery 07/07/2018 which was complicated by preeclampsia, shoulder dystocia, and retained placenta, she had a postpartum course complicated by mastitis and postpartum depression. She tried an antidepressant, but did not feel well on the medication and sought help with counseling on the Open Education officer, community. She reports doing well at this point and no longer needing a therapist.   Not currently sexually active. Continues to take the POP that she was prescribed postpartum while breast feeding. Has no contraindications to [facility].  Last Pap: 08/18/18  Results were: no abnormalities Hx of STDs: GC, Chlamydia   There is a family history of breast cancer in her paternal great aunt. Genetic testing is not indicated. There is no FH of ovarian cancer. The patient does do self-breast exams.  Tobacco use: The patient denies current or previous tobacco use. Alcohol use: social drinker Exercise: moderately active lifestyle, does do some walking Most recent lipid panel this year was normal.  Does get adequate calcium in her diet.   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   Anxiety    Postpartum depression 2020    Past Surgical History:  Procedure Laterality Date   WISDOM TOOTH EXTRACTION  2011   Medication:  Current Outpatient Medications:    acetaminophen (TYLENOL) 500 MG tablet, Take 500 mg by mouth every 6 (six) hours as needed., Disp: , Rfl:    norethindrone (MICRONOR) 0.35 MG tablet, Take 1 tablet (0.35 mg total) by mouth daily., Disp: 1 tablet, Rfl: 0   Allergies: No Known Allergies  Obstetric History: G1P1001  Social History   Socioeconomic History   Marital status: Single    Spouse name: Not on file   Number of children: 2   Years of education: Not on file   Highest education level: Not on file  Occupational History   Not on file  Tobacco Use   Smoking status: Never Smoker   Smokeless tobacco: Never Used  Vaping Use   Vaping Use: Never used  Substance and Sexual Activity   Alcohol use: Yes    Alcohol/week: 1.0 standard drink    Types: 1 Glasses of wine per week    Comment: before bedtime occ   Drug use: No   Sexual activity: Not Currently    Partners: Male    Birth control/protection: Pill  Other Topics Concern   Not on file  Social History Narrative   Not on file   Social Determinants of Health   Financial Resource Strain:    Difficulty of Paying Living  Expenses:   Food Insecurity:    Worried About Programme researcher, broadcasting/film/video in the Last Year:    Barista in the Last Year:   Transportation Needs:    Freight forwarder (Medical):    Lack of Transportation (Non-Medical):   Physical Activity:    Days of Exercise per Week:    Minutes of Exercise per Session:   Stress:    Feeling of Stress :   Social Connections:    Frequency of Communication with Friends and Family:    Frequency of Social Gatherings with Friends and Family:    Attends Religious Services:    Active Member of Clubs or Organizations:     Attends Engineer, structural:    Marital Status:   Intimate Partner Violence:    Fear of Current or Ex-Partner:    Emotionally Abused:    Physically Abused:    Sexually Abused:     Family History  Problem Relation Age of Onset   Breast cancer Other    Brain cancer Other    Cancer Other        pancreatic    Review of Systems  Constitutional: Positive for weight loss. Negative for chills and fever.       Some change in appetite  HENT: Negative.  Negative for congestion, sinus pain and sore throat.   Eyes: Negative.  Negative for blurred vision and pain.  Respiratory: Negative.  Negative for hemoptysis, shortness of breath and wheezing.   Cardiovascular: Negative.  Negative for chest pain, palpitations and leg swelling.  Gastrointestinal: Positive for constipation. Negative for abdominal pain, blood in stool, diarrhea, heartburn, nausea and vomiting.  Genitourinary: Negative.  Negative for dysuria, frequency, hematuria and urgency.  Musculoskeletal: Negative.  Negative for back pain, joint pain and myalgias.  Skin: Negative.  Negative for itching and rash.  Neurological: Positive for tingling (in hands) and headaches. Negative for dizziness.  Endo/Heme/Allergies: Negative for environmental allergies and polydipsia. Does not bruise/bleed easily.       Negative for hirsutism   Psychiatric/Behavioral: Positive for depression. The patient is nervous/anxious. The patient does not have insomnia.      Physical Exam BP (!) 106/60    Pulse 73    Ht 5\' 2"  (1.575 m)    Wt 103 lb (46.7 kg)    LMP 07/21/2019 (Approximate)    BMI 18.84 kg/m    Physical Exam Constitutional:      General: She is not in acute distress.    Appearance: She is well-developed.  Genitourinary:     Pelvic exam was performed with patient supine.     Urethra normal.     No inguinal adenopathy present in the right or left side.    No urethral caruncle present.     Bladder exam comments:  Possibly tender to deep palpation.     Genitourinary Comments: Vulva: no lesions, no inflammation. Normal Bartholin and Skene's gland Vagina: white vaginal discharge, no prolapse Cervix: no lesions, pink, parous, no CMT Uterus:  Mobile, AV,  NSSC, ?tender Adnexa: no masses, NT  HENT:     Head: Normocephalic and atraumatic.  Eyes:     General: No scleral icterus. Neck:     Thyroid: No thyromegaly.  Cardiovascular:     Rate and Rhythm: Normal rate and regular rhythm.     Heart sounds: No murmur heard.  No friction rub. No gallop.   Pulmonary:     Effort: Pulmonary effort is normal. No respiratory  distress.     Breath sounds: Normal breath sounds. No wheezing or rales.  Chest:     Breasts:        Right: Normal. No mass, nipple discharge, skin change or tenderness.        Left: Normal. No mass, nipple discharge, skin change or tenderness.  Abdominal:     Comments: Soft, large diastasis, tenderness midline and 3-4  centimeters above the umbilicus. Not TTP in lower abdomen. No hepatomegaly.   Musculoskeletal:        General: No tenderness. Normal range of motion.     Cervical back: Normal range of motion and neck supple.  Lymphadenopathy:     Cervical: No cervical adenopathy.     Upper Body:     Right upper body: No supraclavicular or axillary adenopathy.     Left upper body: No supraclavicular or axillary adenopathy.     Lower Body: No right inguinal and no right inguinal adenopathy. No left inguinal and no left inguinal adenopathy.  Neurological:     Mental Status: She is alert and oriented to person, place, and time.     Cranial Nerves: No cranial nerve deficit.  Skin:    General: Skin is warm and dry.     Findings: No erythema or rash.  Psychiatric:        Behavior: Behavior normal.        Judgment: Judgment normal.       Assessment: 29 y.o. G8P1001 female here for routine annual gynecologic examination Lrge diastasis with possible ventral hernia Pain in mons pubis  with menses ?etiology Subjective menorrhagia on progesterone only pills. Plan: Problem List Items Addressed This Visit    None    Visit Diagnoses    Screening for cervical cancer    -  Primary   Relevant Orders   Cytology - PAP   Ventral hernia without obstruction or gangrene       Relevant Orders   Ambulatory referral to General Surgery    Discussed with patient changing contraception to decrease menstrual flow. Offered 24+ day pill or extended cycle pills like Seasonique. She wanted to go back to Lo Loestrin . RX for Lo Loestrin sent to pharmacy. Follow up in 3 months for blood pressure check and to follow up on pain in mons with menses.   Screening: -- Blood pressure screen normal -- Weight screening: normal -- cholesterol screening: UTD -- osteoporosis screening: not due -- tobacco screening: not using -- family history of breast cancer screening: done. not at high risk -- STD screening: gonorrhea/chlamydia NAAT done -- pap smear done     Farrel Conners, CNM

## 2019-08-21 ENCOUNTER — Encounter: Payer: Self-pay | Admitting: Certified Nurse Midwife

## 2019-08-21 ENCOUNTER — Ambulatory Visit (INDEPENDENT_AMBULATORY_CARE_PROVIDER_SITE_OTHER): Payer: Self-pay | Admitting: Certified Nurse Midwife

## 2019-08-21 ENCOUNTER — Other Ambulatory Visit: Payer: Self-pay

## 2019-08-21 VITALS — BP 106/60 | HR 73 | Ht 62.0 in | Wt 103.0 lb

## 2019-08-21 DIAGNOSIS — Z124 Encounter for screening for malignant neoplasm of cervix: Secondary | ICD-10-CM

## 2019-08-21 DIAGNOSIS — K439 Ventral hernia without obstruction or gangrene: Secondary | ICD-10-CM

## 2019-08-22 MED ORDER — LO LOESTRIN FE 1 MG-10 MCG / 10 MCG PO TABS: 1.0000 | ORAL_TABLET | Freq: Every day | ORAL | 3 refills | Status: DC

## 2019-08-23 ENCOUNTER — Encounter: Payer: Self-pay | Admitting: Certified Nurse Midwife

## 2019-08-25 LAB — CYTOLOGY - PAP: Diagnosis: NEGATIVE

## 2019-08-27 ENCOUNTER — Other Ambulatory Visit: Payer: Self-pay | Admitting: Certified Nurse Midwife

## 2019-11-19 ENCOUNTER — Ambulatory Visit: Payer: Self-pay | Admitting: Obstetrics and Gynecology

## 2019-11-23 ENCOUNTER — Ambulatory Visit: Payer: Self-pay | Admitting: Obstetrics and Gynecology

## 2019-11-24 IMAGING — US US PELVIS COMPLETE
1 series · 14 of 25 positions shown · non-contrast
Comparison: None.

CLINICAL DATA: Postpartum hemorrhage

EXAM:
TRANSABDOMINAL ULTRASOUND OF PELVIS
TECHNIQUE: Transabdominal ultrasound examination of the pelvis was performed
including evaluation of the uterus, ovaries, adnexal regions, and
pelvic cul-de-sac.

[Series 1: us pelvis complete · 14 of 57 slices shown]
[im 1/57]
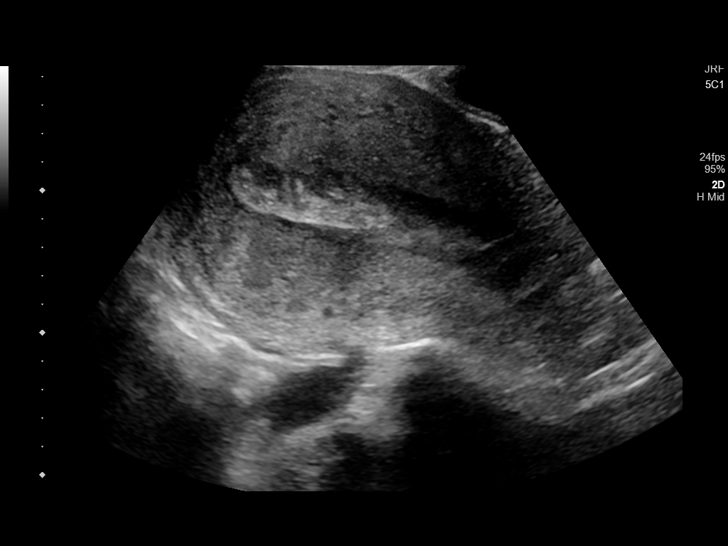
[im 5/57]
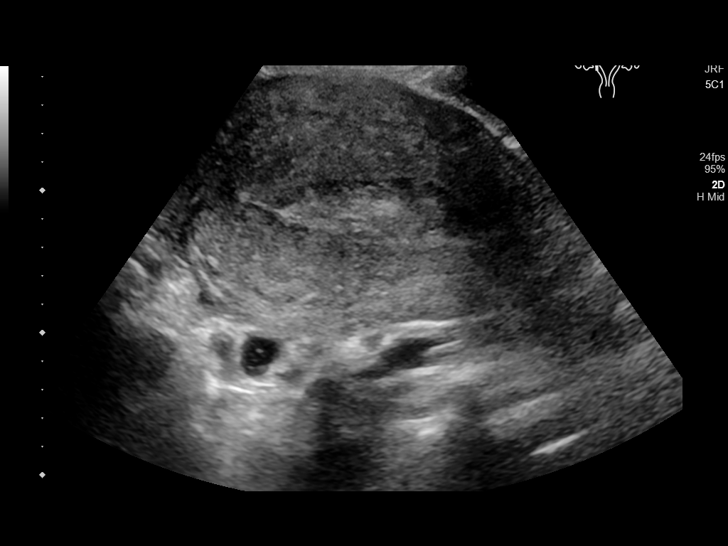
[im 10/57]
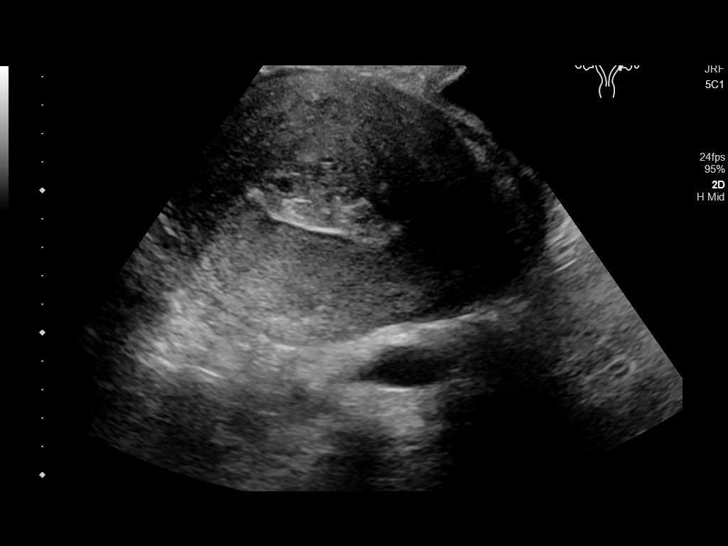
[im 15/57]
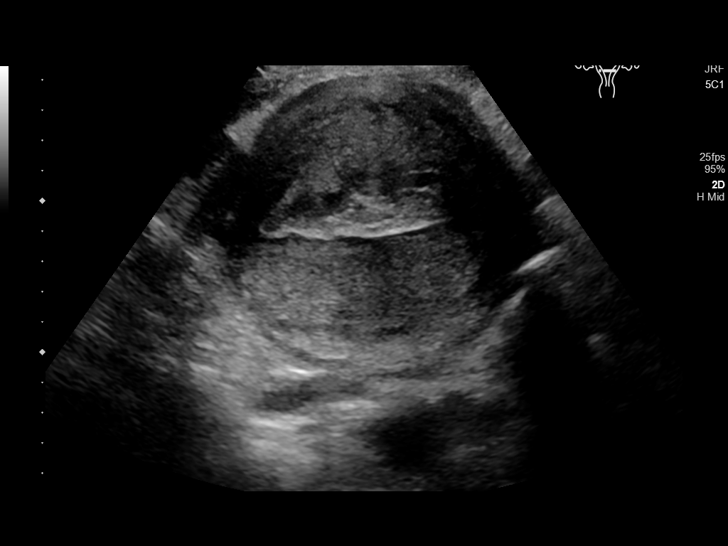
[im 19/57]
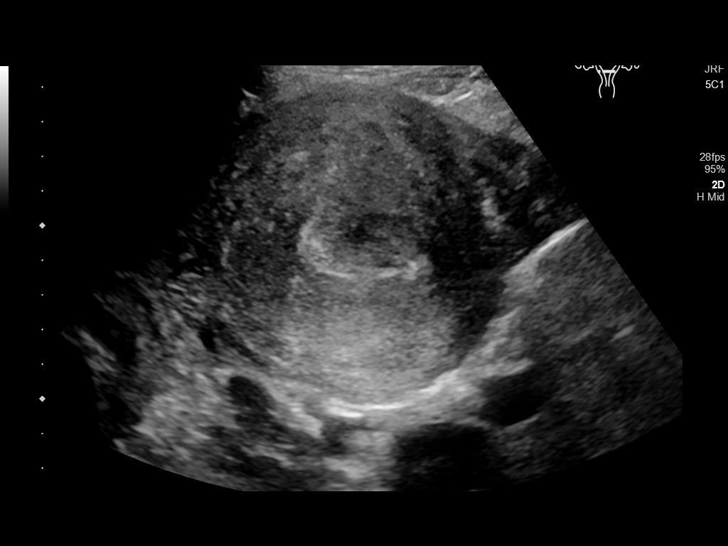
[im 22/57]
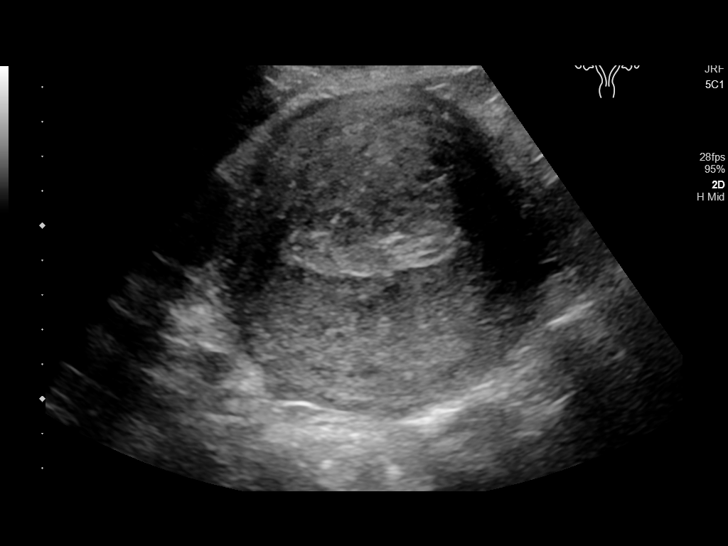
[im 26/57]
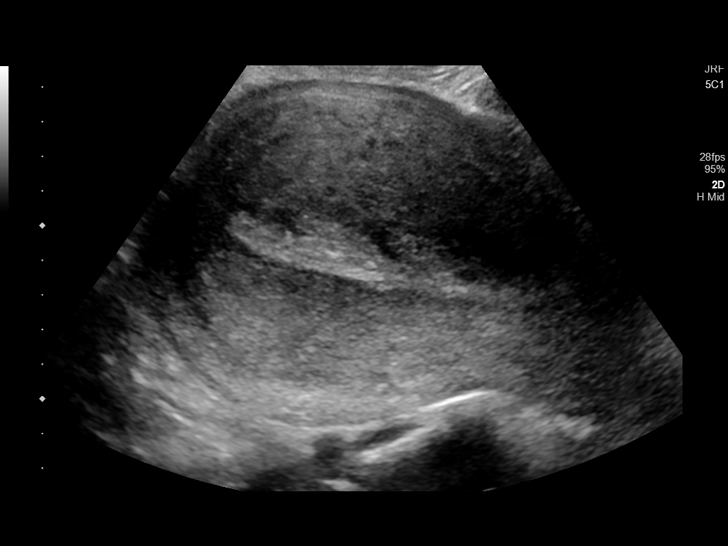
[im 31/57]
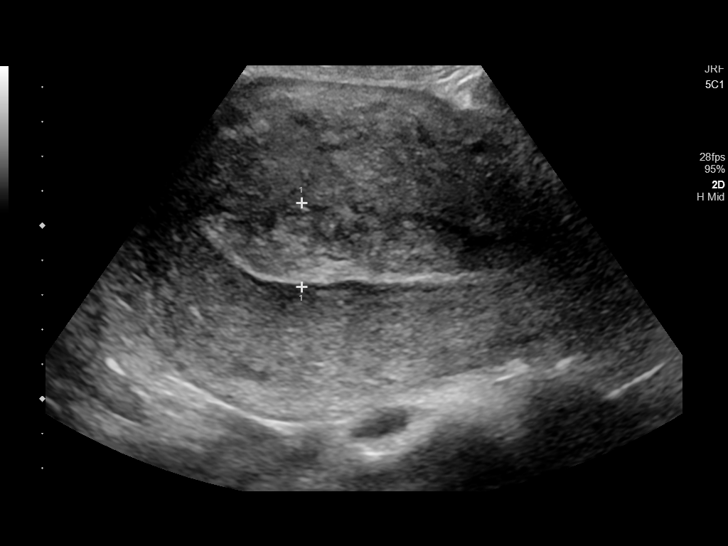
[im 36/57]
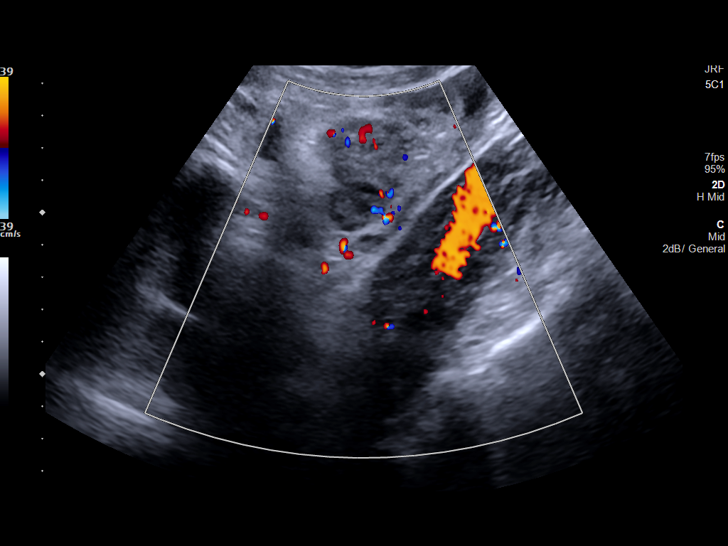
[im 38/57]
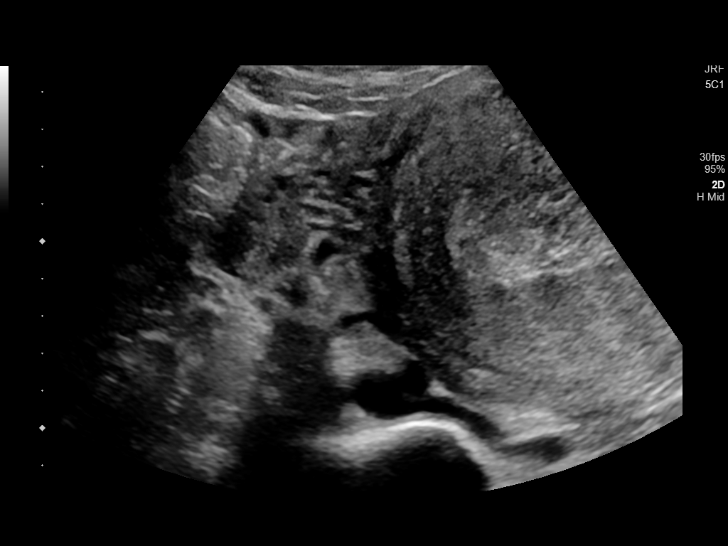
[im 43/57]
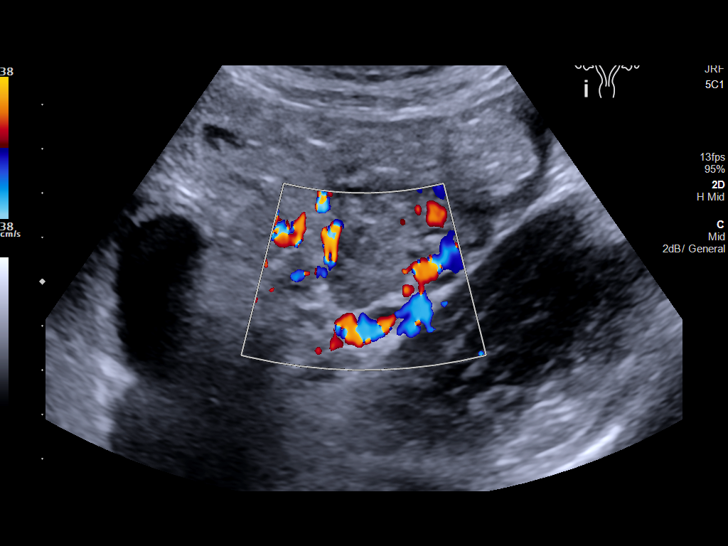
[im 47/57]
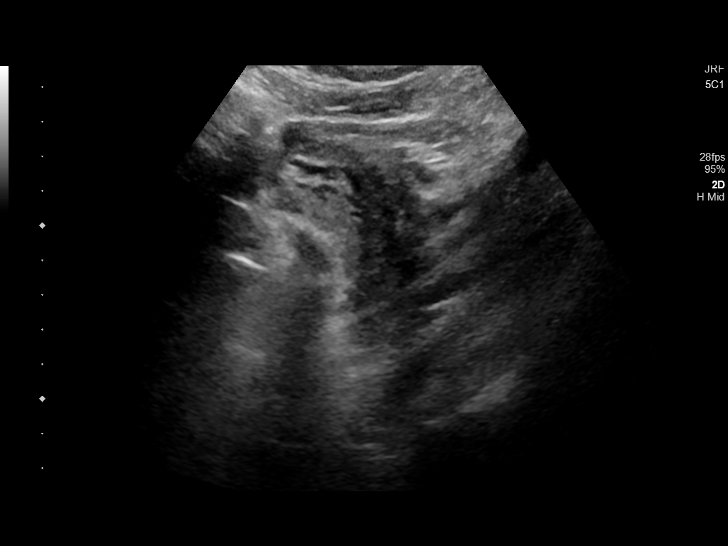
[im 52/57]
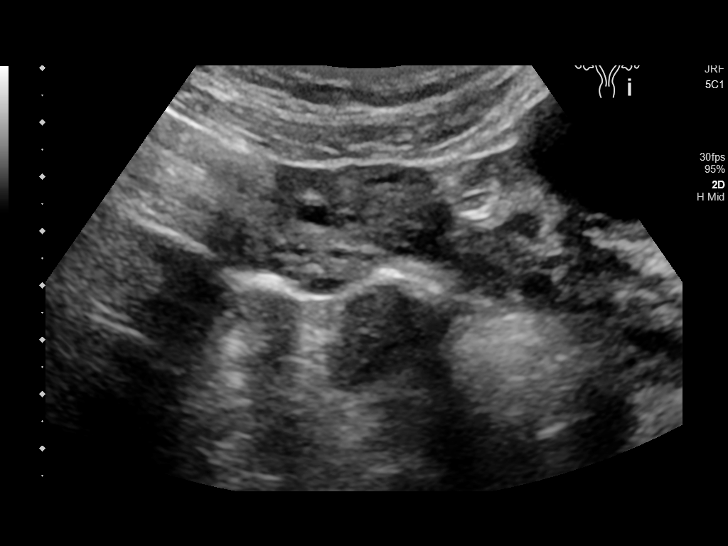
[im 57/57]
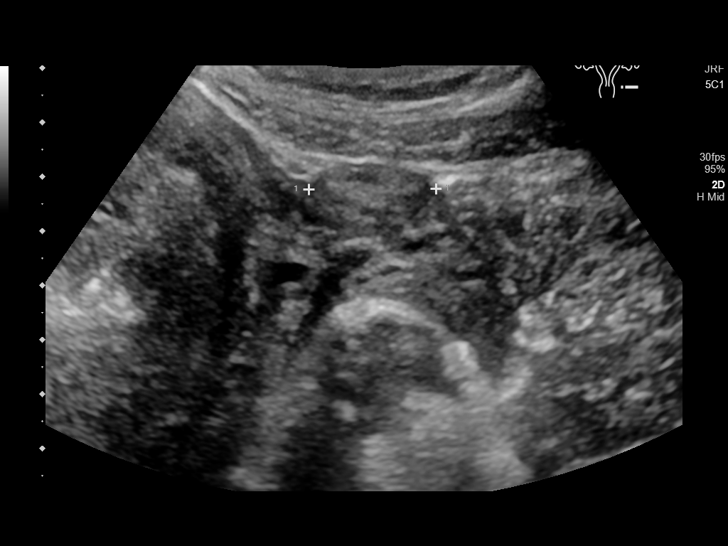

[14 of 25 positions shown; findings below may reference images not displayed]

FINDINGS: Uterus

Measurements: 17.9 x 10.4 x 10.4 cm. = volume: 7477 mL. No fibroids
or other mass visualized.

Endometrium

Thickness: 24.2 mm. Heterogeneity is noted within the endometrial
stripe

Right ovary

Measurements: 2.5 x 1.6 x 2.3 cm = volume: 4.6 mL. Normal
appearance/no adnexal mass.

Left ovary

Measurements: 3.2 x 2.0 x 2.3 cm = volume: 7.8 mL. Normal
appearance/no adnexal mass.

Other findings:  No abnormal free fluid.
IMPRESSION: Heterogeneous endometrium although no significant increased blood
flow is identified to suggest retained products.

Normal-appearing postpartum uterus.

## 2020-04-28 NOTE — Patient Instructions (Incomplete)
I value your feedback and you entrusting us with your care. If you get a [facility] patient survey, I would appreciate you taking the time to let us know about your experience today. Thank you! ? ? ?

## 2020-04-28 NOTE — Progress Notes (Deleted)
    [facility] [place]   No chief complaint on file.   HPI:      [name] is a 30 y.o. [id number] whose LMP was No LMP recorded., presents today for ***    Past Medical History:  Diagnosis Date  . 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 Anxiety   . Normal labor and delivery 07/07/2018  . Postpartum depression 2020    Past Surgical History:  Procedure Laterality Date  . WISDOM TOOTH EXTRACTION  2011    Family History  Problem Relation Age of Onset  . Breast cancer Other   . Brain cancer Other   . Cancer Other        pancreatic    Social History   Socioeconomic History  . Marital status: Single    Spouse name: Not on file  . Number of children: 2  . Years of education: Not on file  . Highest education level: Not on file  Occupational History  . Not on file  Tobacco Use  . Smoking status: Never Smoker  . Smokeless tobacco: Never Used  Vaping Use  . Vaping Use: Never used  Substance and Sexual Activity  . Alcohol use: Yes    Alcohol/week: 1.0 standard drink    Types: 1 Glasses of wine per week    Comment: before bedtime occ  . Drug use: No  . Sexual activity: Not Currently    Partners: Male    Birth control/protection: Pill  Other Topics Concern  . Not on file  Social History Narrative  . Not on file   Social Determinants of Health   Financial Resource Strain: Not on file  Food Insecurity: Not on file  Transportation Needs: Not on file  Physical Activity: Not on file  Stress: Not on file  Social Connections: Not on file  Intimate Partner Violence: Not on file    Outpatient Medications Prior to Visit  Medication Sig Dispense Refill  . acetaminophen (TYLENOL) 500 MG tablet Take 500 mg by mouth every 6 (six) hours as needed.    . norethindrone (MICRONOR) 0.35 MG tablet Take 1 tablet (0.35 mg total) by mouth daily. 1 tablet 0  . Norethindrone-Ethinyl Estradiol-Fe Biphas (LO LOESTRIN FE) 1 MG-10 MCG / 10 MCG tablet  Take 1 tablet by mouth daily. 84 tablet 3   No facility-administered medications prior to visit.      ROS:  Review of Systems BREAST: No symptoms   OBJECTIVE:   Vitals:  There were no vitals taken for this visit.  Physical Exam  Results: No results found for this or any previous visit (from the past 24 hour(s)).   Assessment/Plan: No diagnosis found.    No orders of the defined types were placed in this encounter.     No follow-ups on file.  Mirabelle Cyphers B. Cindee Mclester, PA-C 04/28/2020 11:06 AM

## 2020-04-29 ENCOUNTER — Ambulatory Visit: Payer: Self-pay | Admitting: Obstetrics and Gynecology

## 2020-05-27 NOTE — Progress Notes (Signed)
[facility], [place]   Chief Complaint  Patient presents with  . Vaginal Discharge    No itchiness/odor x 1 month    HPI:      [name] is a 30 y.o. [id number] whose LMP was Patient's last menstrual period was 05/09/2020 (approximate)., presents today for increased clear to cloudy vag d/c without itch/odor for the past month. She is not sex active and isn't concerned about STD testing. No prior abx use, no meds to treat. No urin sx, LBP, pelvic pain, fevers.    Past Medical History:  Diagnosis Date  . 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 Anxiety   . Normal labor and delivery 07/07/2018  . Postpartum depression 2020    Past Surgical History:  Procedure Laterality Date  . WISDOM TOOTH EXTRACTION  2011    Family History  Problem Relation Age of Onset  . Breast cancer Other   . Brain cancer Other   . Cancer Other        pancreatic    Social History   Socioeconomic History  . Marital status: Single    Spouse name: Not on file  . Number of children: 2  . Years of education: Not on file  . Highest education level: Not on file  Occupational History  . Not on file  Tobacco Use  . Smoking status: Never Smoker  . Smokeless tobacco: Never Used  Vaping Use  . Vaping Use: Never used  Substance and Sexual Activity  . Alcohol use: Yes    Alcohol/week: 1.0 standard drink    Types: 1 Glasses of wine per week    Comment: before bedtime occ  . Drug use: No  . Sexual activity: Not Currently    Partners: Male    Birth control/protection: None  Other Topics Concern  . Not on file  Social History Narrative  . Not on file   Social Determinants of Health   Financial Resource Strain: Not on file  Food Insecurity: Not on file  Transportation Needs: Not on file  Physical Activity: Not on file  Stress: Not on file  Social Connections: Not on file  Intimate Partner Violence: Not on file    Outpatient Medications Prior to Visit   Medication Sig Dispense Refill  . acetaminophen (TYLENOL) 500 MG tablet Take 500 mg by mouth every 6 (six) hours as needed.    . norethindrone (MICRONOR) 0.35 MG tablet Take 1 tablet (0.35 mg total) by mouth daily. 1 tablet 0  . Norethindrone-Ethinyl Estradiol-Fe Biphas (LO LOESTRIN FE) 1 MG-10 MCG / 10 MCG tablet Take 1 tablet by mouth daily. 84 tablet 3   No facility-administered medications prior to visit.      ROS:  Review of Systems  Constitutional: Negative for fever.  Gastrointestinal: Negative for blood in stool, constipation, diarrhea, nausea and vomiting.  Genitourinary: Positive for vaginal discharge. Negative for dyspareunia, dysuria, flank pain, frequency, hematuria, urgency, vaginal bleeding and vaginal pain.  Musculoskeletal: Negative for back pain.  Skin: Negative for rash.    OBJECTIVE:   Vitals:  BP 100/70   Ht 5\' 2"  (1.575 m)   Wt 102 lb (46.3 kg)   LMP 05/09/2020 (Approximate)   Breastfeeding No   BMI 18.66 kg/m   Physical Exam Vitals reviewed.  Constitutional:      Appearance: She is well-developed.  Pulmonary:     Effort: Pulmonary effort is normal.  Genitourinary:    General: Normal vulva.  Pubic Area: No rash.      Labia:        Right: No rash, tenderness or lesion.        Left: No rash, tenderness or lesion.      Vagina: Normal. No vaginal discharge, erythema or tenderness.     Cervix: Normal.     Uterus: Normal. Not enlarged and not tender.      Adnexa: Right adnexa normal and left adnexa normal.       Right: No mass or tenderness.         Left: No mass or tenderness.    Musculoskeletal:        General: Normal range of motion.     Cervical back: Normal range of motion.  Skin:    General: Skin is warm and dry.  Neurological:     General: No focal deficit present.     Mental Status: She is alert and oriented to person, place, and time.  Psychiatric:        Mood and Affect: Mood normal.        Behavior: Behavior normal.         Thought Content: Thought content normal.        Judgment: Judgment normal.     Results: Results for orders placed or performed in visit on 05/30/20 (from the past 24 hour(s))  POCT Wet Prep with KOH     Status: Normal   Collection Time: 05/30/20  9:18 AM  Result Value Ref Range   Trichomonas, UA Negative    Clue Cells Wet Prep HPF POC neg    Epithelial Wet Prep HPF POC     Yeast Wet Prep HPF POC neg    Bacteria Wet Prep HPF POC     RBC Wet Prep HPF POC     WBC Wet Prep HPF POC     KOH Prep POC Negative Negative     Assessment/Plan: Vaginal discharge - Plan: POCT Wet Prep with KOH, NuSwab Vaginitis (VG); neg wet prep, normal d/c on exam. Check nuswab. Will f/u if pos. If neg, reassurance of normal d/c.    Return if symptoms worsen or fail to improve.  Jkai Arwood B. Ricarda Atayde, PA-C 05/30/2020 9:19 AM

## 2020-05-27 NOTE — Patient Instructions (Signed)
I value your feedback and you entrusting us with your care. If you get a [facility] patient survey, I would appreciate you taking the time to let us know about your experience today. Thank you! ? ? ?

## 2020-05-30 ENCOUNTER — Encounter: Payer: Self-pay | Admitting: Obstetrics and Gynecology

## 2020-05-30 ENCOUNTER — Other Ambulatory Visit: Payer: Self-pay

## 2020-05-30 ENCOUNTER — Ambulatory Visit (INDEPENDENT_AMBULATORY_CARE_PROVIDER_SITE_OTHER): Payer: Managed Care, Other (non HMO) | Admitting: Obstetrics and Gynecology

## 2020-05-30 VITALS — BP 100/70 | Ht 62.0 in | Wt 102.0 lb

## 2020-05-30 DIAGNOSIS — N898 Other specified noninflammatory disorders of vagina: Secondary | ICD-10-CM | POA: Diagnosis not present

## 2020-05-30 LAB — POCT WET PREP WITH KOH
Clue Cells Wet Prep HPF POC: NEGATIVE
KOH Prep POC: NEGATIVE
Trichomonas, UA: NEGATIVE
Yeast Wet Prep HPF POC: NEGATIVE

## 2020-06-02 LAB — NUSWAB VAGINITIS (VG)
Candida albicans, NAA: NEGATIVE
Candida glabrata, NAA: NEGATIVE
Trich vag by NAA: NEGATIVE

## 2020-08-23 ENCOUNTER — Ambulatory Visit: Payer: Self-pay | Admitting: Advanced Practice Midwife
